# Patient Record
Sex: Female | Born: 1970 | Race: Black or African American | Hispanic: No | Marital: Single | Smoking: Current every day smoker
Health system: Southern US, Community
[De-identification: ages and names within clinical notes are randomized; demographics above are authoritative.]

## PROBLEM LIST (undated history)

## (undated) DIAGNOSIS — IMO0002 Reserved for concepts with insufficient information to code with codable children: Secondary | ICD-10-CM

## (undated) DIAGNOSIS — F1020 Alcohol dependence, uncomplicated: Secondary | ICD-10-CM

## (undated) DIAGNOSIS — I10 Essential (primary) hypertension: Secondary | ICD-10-CM

## (undated) DIAGNOSIS — M1990 Unspecified osteoarthritis, unspecified site: Secondary | ICD-10-CM

## (undated) DIAGNOSIS — M329 Systemic lupus erythematosus, unspecified: Secondary | ICD-10-CM

## (undated) HISTORY — PX: HERNIA REPAIR: SHX51

## (undated) HISTORY — PX: NO PAST SURGERIES: SHX2092

---

## 2004-04-21 ENCOUNTER — Observation Stay: Payer: Self-pay | Admitting: Obstetrics & Gynecology

## 2004-06-11 ENCOUNTER — Observation Stay: Payer: Self-pay

## 2004-06-14 ENCOUNTER — Emergency Department: Payer: Self-pay | Admitting: Emergency Medicine

## 2004-06-25 ENCOUNTER — Inpatient Hospital Stay: Payer: Self-pay

## 2007-03-30 ENCOUNTER — Emergency Department: Payer: Self-pay | Admitting: Emergency Medicine

## 2008-11-12 ENCOUNTER — Emergency Department: Payer: Self-pay | Admitting: Emergency Medicine

## 2010-04-09 ENCOUNTER — Emergency Department: Payer: Self-pay | Admitting: Emergency Medicine

## 2013-05-07 HISTORY — PX: FOREIGN BODY REMOVAL: SHX962

## 2016-04-01 ENCOUNTER — Encounter: Payer: Self-pay | Admitting: Emergency Medicine

## 2016-04-01 ENCOUNTER — Emergency Department: Payer: Medicaid Other

## 2016-04-01 ENCOUNTER — Emergency Department
Admission: EM | Admit: 2016-04-01 | Discharge: 2016-04-01 | Disposition: A | Discharge status: Home / Self Care | Payer: Medicaid Other | Attending: Emergency Medicine | Admitting: Emergency Medicine

## 2016-04-01 DIAGNOSIS — Y999 Unspecified external cause status: Secondary | ICD-10-CM | POA: Insufficient documentation

## 2016-04-01 DIAGNOSIS — Y9389 Activity, other specified: Secondary | ICD-10-CM | POA: Diagnosis not present

## 2016-04-01 DIAGNOSIS — W500XXA Accidental hit or strike by another person, initial encounter: Secondary | ICD-10-CM | POA: Diagnosis not present

## 2016-04-01 DIAGNOSIS — S60152A Contusion of left little finger with damage to nail, initial encounter: Secondary | ICD-10-CM

## 2016-04-01 DIAGNOSIS — R52 Pain, unspecified: Secondary | ICD-10-CM

## 2016-04-01 DIAGNOSIS — T1490XA Injury, unspecified, initial encounter: Secondary | ICD-10-CM

## 2016-04-01 DIAGNOSIS — S6010XA Contusion of unspecified finger with damage to nail, initial encounter: Secondary | ICD-10-CM

## 2016-04-01 DIAGNOSIS — S6992XA Unspecified injury of left wrist, hand and finger(s), initial encounter: Secondary | ICD-10-CM | POA: Diagnosis present

## 2016-04-01 DIAGNOSIS — Y929 Unspecified place or not applicable: Secondary | ICD-10-CM | POA: Diagnosis not present

## 2016-04-01 MED ORDER — HYDROCODONE-ACETAMINOPHEN 5-325 MG PO TABS: 1.0000 | ORAL_TABLET | ORAL | 0 refills | Status: DC | PRN

## 2016-04-01 MED ORDER — HYDROCODONE-ACETAMINOPHEN 5-325 MG PO TABS
1.0000 | ORAL_TABLET | Freq: Once | ORAL | Status: AC
  Administered 2016-04-01: 1 via ORAL
  Filled 2016-04-01: qty 1
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
## 2016-04-01 NOTE — ED Notes (Signed)
Patient reports being dropped off by cousin and did not drive to ER. Patient educated on not being able to drive with pain medicine and stated "I am not driving" PAtient reports she cannot find a ride. Patient smells of ETOH
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
## 2016-09-15 ENCOUNTER — Emergency Department: Payer: Medicaid Other

## 2016-09-15 ENCOUNTER — Emergency Department
Admission: EM | Admit: 2016-09-15 | Discharge: 2016-09-15 | Disposition: A | Discharge status: Home / Self Care | Payer: Medicaid Other | Attending: Emergency Medicine | Admitting: Emergency Medicine

## 2016-09-15 ENCOUNTER — Encounter: Payer: Self-pay | Admitting: *Deleted

## 2016-09-15 DIAGNOSIS — R109 Unspecified abdominal pain: Secondary | ICD-10-CM | POA: Diagnosis present

## 2016-09-15 DIAGNOSIS — J690 Pneumonitis due to inhalation of food and vomit: Secondary | ICD-10-CM

## 2016-09-15 DIAGNOSIS — F10920 Alcohol use, unspecified with intoxication, uncomplicated: Secondary | ICD-10-CM

## 2016-09-15 DIAGNOSIS — F10120 Alcohol abuse with intoxication, uncomplicated: Secondary | ICD-10-CM | POA: Diagnosis not present

## 2016-09-15 DIAGNOSIS — F17210 Nicotine dependence, cigarettes, uncomplicated: Secondary | ICD-10-CM | POA: Insufficient documentation

## 2016-09-15 DIAGNOSIS — Z79899 Other long term (current) drug therapy: Secondary | ICD-10-CM | POA: Diagnosis not present

## 2016-09-15 DIAGNOSIS — R079 Chest pain, unspecified: Secondary | ICD-10-CM

## 2016-09-15 HISTORY — DX: Alcohol dependence, uncomplicated: F10.20

## 2016-09-15 LAB — TROPONIN I

## 2016-09-15 LAB — URINE DRUG SCREEN, QUALITATIVE (ARMC ONLY)
Amphetamines, Ur Screen: NOT DETECTED
BARBITURATES, UR SCREEN: NOT DETECTED
BENZODIAZEPINE, UR SCRN: NOT DETECTED
CANNABINOID 50 NG, UR ~~LOC~~: POSITIVE — AB
Cocaine Metabolite,Ur ~~LOC~~: NOT DETECTED
MDMA (Ecstasy)Ur Screen: NOT DETECTED
Methadone Scn, Ur: NOT DETECTED
Opiate, Ur Screen: NOT DETECTED
PHENCYCLIDINE (PCP) UR S: NOT DETECTED
TRICYCLIC, UR SCREEN: POSITIVE — AB

## 2016-09-15 LAB — CBC WITH DIFFERENTIAL/PLATELET
Basophils Absolute: 0 10*3/uL (ref 0–0.1)
Basophils Relative: 0 %
EOS ABS: 0 10*3/uL (ref 0–0.7)
EOS PCT: 0 %
HCT: 43.1 % (ref 35.0–47.0)
HEMOGLOBIN: 14.9 g/dL (ref 12.0–16.0)
LYMPHS ABS: 2.2 10*3/uL (ref 1.0–3.6)
Lymphocytes Relative: 26 %
MCH: 38.4 pg — AB (ref 26.0–34.0)
MCHC: 34.5 g/dL (ref 32.0–36.0)
MCV: 111.1 fL — AB (ref 80.0–100.0)
MONO ABS: 0.2 10*3/uL (ref 0.2–0.9)
MONOS PCT: 3 %
Neutro Abs: 5.9 10*3/uL (ref 1.4–6.5)
Neutrophils Relative %: 71 %
PLATELETS: 236 10*3/uL (ref 150–440)
RBC: 3.88 MIL/uL (ref 3.80–5.20)
RDW: 13.7 % (ref 11.5–14.5)
WBC: 8.4 10*3/uL (ref 3.6–11.0)

## 2016-09-15 LAB — COMPREHENSIVE METABOLIC PANEL
ALT: 25 U/L (ref 14–54)
ANION GAP: 10 (ref 5–15)
AST: 71 U/L — ABNORMAL HIGH (ref 15–41)
Albumin: 4.4 g/dL (ref 3.5–5.0)
Alkaline Phosphatase: 52 U/L (ref 38–126)
BUN: 9 mg/dL (ref 6–20)
CALCIUM: 8.8 mg/dL — AB (ref 8.9–10.3)
CO2: 28 mmol/L (ref 22–32)
Chloride: 104 mmol/L (ref 101–111)
Creatinine, Ser: 0.63 mg/dL (ref 0.44–1.00)
Glucose, Bld: 92 mg/dL (ref 65–99)
Potassium: 3.4 mmol/L — ABNORMAL LOW (ref 3.5–5.1)
Sodium: 142 mmol/L (ref 135–145)
Total Bilirubin: 0.9 mg/dL (ref 0.3–1.2)
Total Protein: 8.4 g/dL — ABNORMAL HIGH (ref 6.5–8.1)

## 2016-09-15 LAB — ETHANOL: Alcohol, Ethyl (B): 282 mg/dL — ABNORMAL HIGH (ref <5)

## 2016-09-15 MED ORDER — IOPAMIDOL (ISOVUE-370) INJECTION 76%
75.0000 mL | Freq: Once | INTRAVENOUS | Status: AC | PRN
Start: 2016-09-15 — End: 2016-09-15
  Administered 2016-09-15: 75 mL via INTRAVENOUS

## 2016-09-15 MED ORDER — SODIUM CHLORIDE 0.9 % IV BOLUS (SEPSIS)
1000.0000 mL | Freq: Once | INTRAVENOUS | Status: AC
  Administered 2016-09-15: 1000 mL via INTRAVENOUS

## 2016-09-15 MED ORDER — AMOXICILLIN-POT CLAVULANATE 875-125 MG PO TABS: 1.0000 | ORAL_TABLET | Freq: Two times a day (BID) | ORAL | 0 refills | Status: DC

## 2016-09-15 MED ORDER — GI COCKTAIL ~~LOC~~
30.0000 mL | Freq: Once | ORAL | Status: AC
  Administered 2016-09-15: 30 mL via ORAL
  Filled 2016-09-15: qty 30

## 2016-09-15 MED ORDER — AMOXICILLIN-POT CLAVULANATE 875-125 MG PO TABS
1.0000 | ORAL_TABLET | Freq: Once | ORAL | Status: AC
  Administered 2016-09-15: 1 via ORAL
  Filled 2016-09-15: qty 1

## 2016-09-15 MED ORDER — LEVOFLOXACIN 750 MG PO TABS
750.0000 mg | ORAL_TABLET | Freq: Once | ORAL | Status: AC
  Administered 2016-09-15: 750 mg via ORAL
  Filled 2016-09-15: qty 1

## 2016-09-15 MED ORDER — LEVOFLOXACIN 750 MG PO TABS: 750.0000 mg | ORAL_TABLET | Freq: Every day | ORAL | 0 refills | Status: DC

## 2016-09-15 NOTE — ED Notes (Signed)
Pt verbalized understanding of discharge instructions. NAD at this time. 

## 2016-09-15 NOTE — ED Triage Notes (Signed)
Pt presents w/ L chest pain starting at 0330 today, woken from sleep. Pt admits to ETOH, marijuana and tobacco use last night and daily. Pt is tachycardiac and tachypneic upon arrival. Pt c/o 6 month hx of increasing dyspnea at rest.

## 2016-09-15 NOTE — ED Provider Notes (Signed)
Dg Chest 1 View  Result Date: 09/15/2016 CLINICAL DATA:  Left chest pain starting at 03:30 today.  Smoker. EXAM: CHEST 1 VIEW COMPARISON:  11/12/2008 FINDINGS: The heart size and mediastinal contours are within normal limits. Both lungs are clear. The visualized skeletal structures are unremarkable. IMPRESSION: No active disease. Electronically Signed   By: Burman NievesWilliam  Stevens M.D.   On: 09/15/2016 04:22   Ct Angio Chest Pe W Or Wo Contrast  Result Date: 09/15/2016 CLINICAL DATA:  Chest pain EXAM: CT ANGIOGRAPHY CHEST WITH CONTRAST TECHNIQUE: Multidetector CT imaging of the chest was performed using the standard protocol during bolus administration of intravenous contrast. Multiplanar CT image reconstructions and MIPs were obtained to evaluate the vascular anatomy. CONTRAST:  75 mL Isovue 370 IV COMPARISON:  Chest x-ray 09/15/2016 FINDINGS: Cardiovascular: Negative for pulmonary embolism. Normal heart size. No pericardial effusion. Normal aortic arch. Mediastinum/Nodes: Negative for mass or adenopathy Lungs/Pleura: Moderate infiltrate in the left lower lobe compatible with pneumonia. 16 x 31 mm density in the right lung base above the diaphragm most likely an area of pneumonia as well. This has ill-defined margins and appears to represent infiltrate. No pleural effusion. Upper Abdomen: Negative Musculoskeletal: Negative Review of the MIP images confirms the above findings. IMPRESSION: Left lower lobe infiltrate consistent with pneumonia. Right lower lobe density also represents probable pneumonia. Possible aspiration. No pleural effusion. Negative for pulmonary embolism. Electronically Signed   By: Marlan Palauharles  Clark M.D.   On: 09/15/2016 09:16   ----------------------------------------- 10:10 AM on 09/15/2016 -----------------------------------------  Patient presents for evaluation of chest pain, also found to be intoxicated. CT negative for pulmonary wasn't however does have notable bilateral lower lobe  infiltrates in the setting of alcohol abuse raises suspicion for possible aspiration pneumonia and/or atypical infection combination with alcohol use. She is slightly tachycardic, not febrile with no elevated white count.   ----------------------------------------- 10:18 AM on 09/15/2016 -----------------------------------------  Patient awake alert. Discussed with her that she is pneumonia and both lungs, she reports she's been having some symptoms and cough for at least a week. She has no hypoxia, and I discussed and offered admission for antibiotic treatment as pneumonias in both lungs, but patient reports that she has children that she helps care for that she feels well enough to be at home. I would agree that she does not appear toxic, she does not appear acutely dyspneic, and we'll prescribe her dual antibiotic therapy including Augmentin because of possible aspiration as well as Levaquin to cover for typical community acquired pneumonia. She denies any recent hospitalizations.  She does have a frequent dry cough and minimal rales in the left lower lung. Otherwise clear with no wheezing. Speaks in full clear sentences.  Return precautions and treatment recommendations and follow-up discussed with the patient who is agreeable with the plan.    Sharyn CreamerQuale, Mark, MD 09/15/16 1020

## 2016-09-15 NOTE — ED Provider Notes (Signed)
[facility] Emergency Department Provider Note  ____________________________________________   First MD Initiated Contact with Patient 09/15/16 0402     (approximate)  I have reviewed the triage vital signs and the nursing notes.   HISTORY  Chief Complaint Chest Pain   HPI Lindsay Price is a 46 y.o. female history of alcoholism who is presenting to the emergency department with left-sided chest pain. She says that she woke from sleep about a half an hour prior to arrival with a sharp 10 out of 10 as had a chest pain. Does not report any radiation or shortness of breath. Denies any vomiting. Says that she has had another amount of alcohol to drink today and says that she drinks multiple drinks per day and is a "alcoholic." Denies any cardiac history. Says she also smokes marijuana and last smoked tonight. However, she denies any symptoms on a normal basis with her marijuana smoking. Patient also saying this feels similar to her previous episodes of heartburn.   Past Medical History:  Diagnosis Date  . Alcoholism (HCC)     There are no active problems to display for this patient.   History reviewed. No pertinent surgical history.  Prior to Admission medications   Medication Sig Start Date End Date Taking? Authorizing Provider  HYDROcodone-acetaminophen (NORCO/VICODIN) 5-325 MG tablet Take 1 tablet by mouth every 4 (four) hours as needed for moderate pain. 04/01/16   Tommi Rumps, PA-C    Allergies Patient has no known allergies.  History reviewed. No pertinent family history.  Social History Social History  Substance Use Topics  . Smoking status: Current Every Day Smoker    Types: Cigarettes  . Smokeless tobacco: Never Used  . Alcohol use 58.8 oz/week    28 Cans of beer, 70 Shots of liquor per week    Review of Systems  Constitutional: No fever/chills Eyes: No visual changes. ENT: No sore throat. Cardiovascular:  As  above Respiratory: Denies shortness of breath. Gastrointestinal: No abdominal pain.  No nausea, no vomiting.  No diarrhea.  No constipation. Genitourinary: Negative for dysuria. Musculoskeletal: Negative for back pain. Skin: Negative for rash. Neurological: Negative for headaches, focal weakness or numbness.   ____________________________________________   PHYSICAL EXAM:  VITAL SIGNS: ED Triage Vitals  Enc Vitals Group     BP      Pulse      Resp      Temp      Temp src      SpO2      Weight      Height      Head Circumference      Peak Flow      Pain Score      Pain Loc      Pain Edu?      Excl. in GC?     Constitutional: Alert and oriented. Well appearing and in no acute distress. Eyes: Conjunctivae are normal. PERRL. EOMI. Head: Atraumatic. Nose: No congestion/rhinnorhea. Mouth/Throat: Mucous membranes are moist.  Neck: No stridor.   Cardiovascular: Normal rate, regular rhythm. Grossly normal heart sounds. Chest pain is reproducible just left of the sternum. Respiratory: Normal respiratory effort.  No retractions. Lungs CTAB. Gastrointestinal: Soft and nontender. No distention. no CVA tenderness. Musculoskeletal: No lower extremity tenderness nor edema.  No joint effusions. Neurologic:  Normal speech and language. No gross focal neurologic deficits are appreciated.  Skin:  Skin is warm, dry and intact. No rash noted. Psychiatric: Mood and affect are normal.  Speech and behavior are normal.  ____________________________________________   LABS (all labs ordered are listed, but only abnormal results are displayed)  Labs Reviewed  ETHANOL - Abnormal; Notable for the following:       Result Value   Alcohol, Ethyl (B) 282 (*)    All other components within normal limits  CBC WITH DIFFERENTIAL/PLATELET - Abnormal; Notable for the following:    MCV 111.1 (*)    MCH 38.4 (*)    All other components within normal limits  COMPREHENSIVE METABOLIC PANEL - Abnormal;  Notable for the following:    Potassium 3.4 (*)    Calcium 8.8 (*)    Total Protein 8.4 (*)    AST 71 (*)    All other components within normal limits  TROPONIN I  TROPONIN I  URINE DRUG SCREEN, QUALITATIVE ([facility] ONLY)   ____________________________________________  EKG  ED ECG REPORT I, Arelia LongestSchaevitz,  David M, the attending physician, personally viewed and interpreted this ECG.   Date: 09/15/2016  EKG Time: 0404  Rate: 107  Rhythm: sinus tachycardia  Axis: Normal  Intervals:none  ST&T Change: No ST segment elevation or depression. No abnormal T-wave inversion.  ____________________________________________  RADIOLOGY  DG Chest 1 View (Final result)  Result time 09/15/16 04:22:47  Final result by Burman NievesStevens, William, MD (09/15/16 04:22:47)           Narrative:   CLINICAL DATA: Left chest pain starting at 03:30 today. Smoker.  EXAM: CHEST 1 VIEW  COMPARISON: 11/12/2008  FINDINGS: The heart size and mediastinal contours are within normal limits. Both lungs are clear. The visualized skeletal structures are unremarkable.  IMPRESSION: No active disease.   Electronically Signed By: Burman NievesWilliam Stevens M.D. On: 09/15/2016 04:22            ____________________________________________   PROCEDURES  Procedure(s) performed:   Procedures  Critical Care performed:   ____________________________________________   INITIAL IMPRESSION / ASSESSMENT AND PLAN / ED COURSE  Pertinent labs & imaging results that were available during my care of the patient were reviewed by me and considered in my medical decision making (see chart for details).  ----------------------------------------- 7 AM on 09/15/2016 -----------------------------------------  Patient found to be intoxicated. Reevaluated at 5 AM and said the pain was completely gone after GI cocktail. However, at this time the patient is requesting pain medication from the nurse return of her chest pain  which the nurses now in her epigastrium. Patient is still tachycardic at 1 12 bpm. UDS added as well as a CT angiography for pulmonary embolus. Signed out to Dr. Fanny BienQuale.     ____________________________________________   FINAL CLINICAL IMPRESSION(S) / ED DIAGNOSES  Final diagnoses:  Chest pain  Alcohol intoxication.    NEW MEDICATIONS STARTED DURING THIS VISIT:  New Prescriptions   No medications on file     Note:  This document was prepared using Dragon voice recognition software and may include unintentional dictation errors.    Myrna BlazerSchaevitz, David Matthew, MD 09/15/16 [phone]

## 2016-09-15 NOTE — Discharge Instructions (Addendum)

## 2016-09-17 ENCOUNTER — Telehealth: Payer: Self-pay

## 2016-09-17 NOTE — Telephone Encounter (Signed)
lmov for patient to call and schedule ED fu Seen on 09/15/16 for CP °Will try again at a later time °

## 2016-09-18 NOTE — Telephone Encounter (Signed)
lmov for patient to call and schedule ED fu Seen on 09/15/16 for CP Will try again at a later time

## 2016-09-24 NOTE — Telephone Encounter (Signed)
lmov for patient to call and schedule ED fu Seen on 09/15/16 for CP °Will try again at a later time °

## 2016-09-25 NOTE — Telephone Encounter (Signed)
Unable to contact °Sent letter  °

## 2017-11-13 ENCOUNTER — Other Ambulatory Visit: Payer: Self-pay

## 2017-11-13 ENCOUNTER — Inpatient Hospital Stay
Admission: EM | Admit: 2017-11-13 | Discharge: 2017-11-14 | DRG: 917 | Disposition: A | Discharge status: Home Health | Payer: Medicaid Other | Attending: Internal Medicine | Admitting: Internal Medicine

## 2017-11-13 DIAGNOSIS — F17210 Nicotine dependence, cigarettes, uncomplicated: Secondary | ICD-10-CM | POA: Diagnosis present

## 2017-11-13 DIAGNOSIS — E8809 Other disorders of plasma-protein metabolism, not elsewhere classified: Secondary | ICD-10-CM | POA: Diagnosis present

## 2017-11-13 DIAGNOSIS — Z79899 Other long term (current) drug therapy: Secondary | ICD-10-CM

## 2017-11-13 DIAGNOSIS — G92 Toxic encephalopathy: Secondary | ICD-10-CM | POA: Diagnosis present

## 2017-11-13 DIAGNOSIS — Y906 Blood alcohol level of 120-199 mg/100 ml: Secondary | ICD-10-CM | POA: Diagnosis present

## 2017-11-13 DIAGNOSIS — F1994 Other psychoactive substance use, unspecified with psychoactive substance-induced mood disorder: Secondary | ICD-10-CM

## 2017-11-13 DIAGNOSIS — F10239 Alcohol dependence with withdrawal, unspecified: Secondary | ICD-10-CM | POA: Diagnosis present

## 2017-11-13 DIAGNOSIS — T43012A Poisoning by tricyclic antidepressants, intentional self-harm, initial encounter: Secondary | ICD-10-CM | POA: Diagnosis present

## 2017-11-13 DIAGNOSIS — T43014A Poisoning by tricyclic antidepressants, undetermined, initial encounter: Secondary | ICD-10-CM | POA: Diagnosis not present

## 2017-11-13 DIAGNOSIS — F10129 Alcohol abuse with intoxication, unspecified: Secondary | ICD-10-CM

## 2017-11-13 DIAGNOSIS — D7589 Other specified diseases of blood and blood-forming organs: Secondary | ICD-10-CM | POA: Diagnosis present

## 2017-11-13 DIAGNOSIS — E876 Hypokalemia: Secondary | ICD-10-CM | POA: Diagnosis present

## 2017-11-13 DIAGNOSIS — F1010 Alcohol abuse, uncomplicated: Secondary | ICD-10-CM

## 2017-11-13 DIAGNOSIS — T50902A Poisoning by unspecified drugs, medicaments and biological substances, intentional self-harm, initial encounter: Secondary | ICD-10-CM

## 2017-11-13 DIAGNOSIS — I10 Essential (primary) hypertension: Secondary | ICD-10-CM | POA: Diagnosis present

## 2017-11-13 DIAGNOSIS — F10229 Alcohol dependence with intoxication, unspecified: Secondary | ICD-10-CM | POA: Diagnosis present

## 2017-11-13 DIAGNOSIS — T43011A Poisoning by tricyclic antidepressants, accidental (unintentional), initial encounter: Secondary | ICD-10-CM | POA: Diagnosis not present

## 2017-11-13 LAB — COMPREHENSIVE METABOLIC PANEL
ALK PHOS: 66 U/L (ref 38–126)
ALT: 14 U/L (ref 0–44)
AST: 38 U/L (ref 15–41)
Albumin: 3.4 g/dL — ABNORMAL LOW (ref 3.5–5.0)
Anion gap: 11 (ref 5–15)
BUN: 8 mg/dL (ref 6–20)
CALCIUM: 8.7 mg/dL — AB (ref 8.9–10.3)
CHLORIDE: 109 mmol/L (ref 98–111)
CO2: 21 mmol/L — AB (ref 22–32)
CREATININE: 0.58 mg/dL (ref 0.44–1.00)
GFR calc Af Amer: >60 mL/min (ref >60)
GFR calc non Af Amer: >60 mL/min (ref >60)
Glucose, Bld: 96 mg/dL (ref 70–99)
Potassium: 4.4 mmol/L (ref 3.5–5.1)
SODIUM: 141 mmol/L (ref 135–145)
Total Bilirubin: 0.3 mg/dL (ref 0.3–1.2)
Total Protein: 7.3 g/dL (ref 6.5–8.1)

## 2017-11-13 LAB — ETHANOL: Alcohol, Ethyl (B): 169 mg/dL — ABNORMAL HIGH (ref <10)

## 2017-11-13 LAB — SALICYLATE LEVEL: Salicylate Lvl: <7 mg/dL (ref 2.8–30.0)

## 2017-11-13 LAB — URINE DRUG SCREEN, QUALITATIVE (ARMC ONLY)
AMPHETAMINES, UR SCREEN: NOT DETECTED
BENZODIAZEPINE, UR SCRN: NOT DETECTED
CANNABINOID 50 NG, UR ~~LOC~~: POSITIVE — AB
Cocaine Metabolite,Ur ~~LOC~~: NOT DETECTED
MDMA (Ecstasy)Ur Screen: NOT DETECTED
Methadone Scn, Ur: NOT DETECTED
OPIATE, UR SCREEN: NOT DETECTED
Phencyclidine (PCP) Ur S: NOT DETECTED
Tricyclic, Ur Screen: POSITIVE — AB

## 2017-11-13 LAB — BASIC METABOLIC PANEL
Anion gap: 9 (ref 5–15)
BUN: 7 mg/dL (ref 6–20)
CALCIUM: 8.2 mg/dL — AB (ref 8.9–10.3)
CO2: 22 mmol/L (ref 22–32)
Chloride: 111 mmol/L (ref 98–111)
Creatinine, Ser: 0.54 mg/dL (ref 0.44–1.00)
GFR calc Af Amer: >60 mL/min (ref >60)
Glucose, Bld: 100 mg/dL — ABNORMAL HIGH (ref 70–99)
POTASSIUM: 4.4 mmol/L (ref 3.5–5.1)
SODIUM: 142 mmol/L (ref 135–145)

## 2017-11-13 LAB — CBC
HEMATOCRIT: 36.4 % (ref 35.0–47.0)
Hemoglobin: 12.8 g/dL (ref 12.0–16.0)
MCH: 39.6 pg — AB (ref 26.0–34.0)
MCHC: 35.2 g/dL (ref 32.0–36.0)
MCV: 112.6 fL — AB (ref 80.0–100.0)
PLATELETS: 248 10*3/uL (ref 150–440)
RBC: 3.23 MIL/uL — ABNORMAL LOW (ref 3.80–5.20)
RDW: 12.6 % (ref 11.5–14.5)
WBC: 5.4 10*3/uL (ref 3.6–11.0)

## 2017-11-13 LAB — GLUCOSE, CAPILLARY
GLUCOSE-CAPILLARY: 94 mg/dL (ref 70–99)
Glucose-Capillary: 97 mg/dL (ref 70–99)
Glucose-Capillary: 92 mg/dL (ref 70–99)

## 2017-11-13 LAB — PREALBUMIN: PREALBUMIN: 25.5 mg/dL (ref 18–38)

## 2017-11-13 LAB — ACETAMINOPHEN LEVEL: Acetaminophen (Tylenol), Serum: <10 ug/mL — ABNORMAL LOW (ref 10–30)

## 2017-11-13 LAB — MRSA PCR SCREENING: MRSA by PCR: NEGATIVE

## 2017-11-13 LAB — MAGNESIUM: Magnesium: 2.1 mg/dL (ref 1.7–2.4)

## 2017-11-13 MED ORDER — ENOXAPARIN SODIUM 40 MG/0.4ML ~~LOC~~ SOLN
40.0000 mg | SUBCUTANEOUS | Status: DC
  Administered 2017-11-13: 40 mg via SUBCUTANEOUS
  Filled 2017-11-13: qty 0.4

## 2017-11-13 MED ORDER — LORAZEPAM 2 MG/ML IJ SOLN: 0.0000 mg | Freq: Two times a day (BID) | INTRAMUSCULAR | Status: DC

## 2017-11-13 MED ORDER — VITAMIN B-1 100 MG PO TABS
100.0000 mg | ORAL_TABLET | Freq: Every day | ORAL | Status: DC
  Administered 2017-11-13 – 2017-11-14 (×2): 100 mg via ORAL
  Filled 2017-11-13: qty 1

## 2017-11-13 MED ORDER — HYDRALAZINE HCL 20 MG/ML IJ SOLN
10.0000 mg | Freq: Once | INTRAMUSCULAR | Status: AC
  Administered 2017-11-13: 10 mg via INTRAVENOUS

## 2017-11-13 MED ORDER — FOLIC ACID 1 MG PO TABS
1.0000 mg | ORAL_TABLET | Freq: Every day | ORAL | Status: DC
  Administered 2017-11-13 – 2017-11-14 (×2): 1 mg via ORAL
  Filled 2017-11-13: qty 1

## 2017-11-13 MED ORDER — LORAZEPAM 1 MG PO TABS
1.0000 mg | ORAL_TABLET | Freq: Four times a day (QID) | ORAL | Status: DC | PRN
  Administered 2017-11-13: 1 mg via ORAL
  Filled 2017-11-13: qty 1

## 2017-11-13 MED ORDER — LORAZEPAM 2 MG/ML IJ SOLN
0.0000 mg | Freq: Four times a day (QID) | INTRAMUSCULAR | Status: DC
  Administered 2017-11-13: 2 mg via INTRAVENOUS
  Filled 2017-11-13: qty 1

## 2017-11-13 MED ORDER — LACTATED RINGERS IV SOLN
INTRAVENOUS | Status: DC
  Administered 2017-11-13 – 2017-11-14 (×3): via INTRAVENOUS

## 2017-11-13 MED ORDER — ADULT MULTIVITAMIN W/MINERALS CH
1.0000 | ORAL_TABLET | Freq: Every day | ORAL | Status: DC
  Administered 2017-11-13 – 2017-11-14 (×2): 1 via ORAL
  Filled 2017-11-13: qty 1

## 2017-11-13 MED ORDER — SENNOSIDES-DOCUSATE SODIUM 8.6-50 MG PO TABS: 1.0000 | ORAL_TABLET | Freq: Every evening | ORAL | Status: DC | PRN

## 2017-11-13 MED ORDER — AMLODIPINE BESYLATE 10 MG PO TABS
10.0000 mg | ORAL_TABLET | Freq: Every day | ORAL | Status: DC
  Administered 2017-11-13 – 2017-11-14 (×2): 10 mg via ORAL
  Filled 2017-11-13 (×2): qty 1

## 2017-11-13 MED ORDER — BISACODYL 5 MG PO TBEC: 5.0000 mg | DELAYED_RELEASE_TABLET | Freq: Every day | ORAL | Status: DC | PRN

## 2017-11-13 MED ORDER — THIAMINE HCL 100 MG/ML IJ SOLN: 100.0000 mg | Freq: Every day | INTRAMUSCULAR | Status: DC

## 2017-11-13 MED ORDER — SODIUM CHLORIDE 0.9 % IV BOLUS
1000.0000 mL | Freq: Once | INTRAVENOUS | Status: AC
  Administered 2017-11-13: 1000 mL via INTRAVENOUS

## 2017-11-13 MED ORDER — LORAZEPAM 2 MG/ML IJ SOLN: 1.0000 mg | Freq: Four times a day (QID) | INTRAMUSCULAR | Status: DC | PRN

## 2017-11-13 MED ORDER — POTASSIUM CHLORIDE 10 MEQ/100ML IV SOLN
10.0000 meq | INTRAVENOUS | Status: DC
  Administered 2017-11-13 (×2): 10 meq via INTRAVENOUS
  Filled 2017-11-13 (×6): qty 100

## 2017-11-13 MED ORDER — LORAZEPAM 2 MG/ML IJ SOLN: 2.0000 mg | INTRAMUSCULAR | Status: DC | PRN

## 2017-11-13 NOTE — ED Triage Notes (Signed)
Pt arrives to ED via [facility] from home with c/o reported overdose and ETOH ingestion. Per EMS, pt took an unknown amount of 50mg  Amitriptyline tablets at an unknown time. EMS arrives reporting pt responsive to painful stimuli only, but arouses to verbal stimuli by Dr Manson PasseyBrown.

## 2017-11-13 NOTE — Progress Notes (Signed)
Spoke with Counselling psychologistKristi RN at poison control. Updates given. Poison control will call back about 1100 for an update. Will Endorse.

## 2017-11-13 NOTE — Consult Note (Signed)
Name: Lindsay Price MRN: [id number] DOB: 11/02/1970    ADMISSION DATE:  11/13/2017 CONSULTATION DATE: 11/13/2017  REFERRING MD : Dr. Marjie Skiff   CHIEF COMPLAINT: Drug Overdose   BRIEF PATIENT DESCRIPTION:  47 yo female admitted with intentional overdose of amitriptyline during and ETOH ingestion   SIGNIFICANT EVENTS/STUDIES:  07/10 Pt admitted to stepdown unit with intentional overdose of amitriptyline and ETOH   HISTORY OF PRESENT ILLNESS:   This is a 47 yo female with a PMH of ETOH Abuse.  She presented to [facility] ER via EMS on 07/10 following an intentional overdose of an unknown amount of amitriptyline and ETOH ingestion.  Per ER notes the pts significant other told EMS the pt threatened to overdose on pills a week ago.  Upon EMS arrival at pts home she was responsive to painful stimulation only, however in the ER she responded to verbal stimulation.  Lab results revealed an alcohol, ethyl level of 169 and initial EKG revealed NSR with QT/Qtc of 386/496 and QRS duration 96 ms.  She received 2L NS bolus and poison control contacted by ER.  She was subsequently admitted to the stepdown unit by hospitalist team for further workup and treatment.    PAST MEDICAL HISTORY :   has a past medical history of Alcoholism (HCC).  has no past surgical history on file. Prior to Admission medications   Not on File   No Known Allergies  FAMILY HISTORY:  family history is not on file. SOCIAL HISTORY:  reports that she has been smoking cigarettes.  She has never used smokeless tobacco. She reports that she drinks about 58.8 oz of alcohol per week. She reports that she has current or past drug history. Drug: Marijuana.  REVIEW OF SYSTEMS:  Unable to assess pt obtunded   SUBJECTIVE:  Unable to assess pt obtunded, however protecting airway at this time  VITAL SIGNS: Temp:  [97.5 F (36.4 C)] 97.5 F (36.4 C) (07/10 0155) Pulse Rate:  [96-99] 99 (07/10 0330) Resp:  [15-18] 15 (07/10  0330) BP: (102-126)/(79-93) 124/91 (07/10 0330) SpO2:  [98 %-99 %] 99 % (07/10 0330) Weight:  [64.9 kg (143 lb)] 64.9 kg (143 lb) (07/10 0156)  PHYSICAL EXAMINATION: General: acutely ill appearing female, NAD  Neuro: opens eyes to painful stimulation only, PERRL  HEENT: supple, no JVD  Cardiovascular: sinus tach, no R/G Lungs: rhonchi throughout, even, non labored Abdomen: +BS x4, soft, non distended  Musculoskeletal: normal bulk and tone, no edema  Skin: intact no rashes or lesions   Recent Labs  Lab 11/13/17 0202  NA 141  K 4.4  CL 109  CO2 21*  BUN 8  CREATININE 0.58  GLUCOSE 96   Recent Labs  Lab 11/13/17 0202  HGB 12.8  HCT 36.4  WBC 5.4  PLT 248   No results found.  ASSESSMENT / PLAN: Acute encephalopathy secondary to intentional drug overdose (amitriptyline) and ETOH intoxication  Hx: ETOH Abuse P: Supplemental O2 for dyspnea and/or hypoxia  Continuous telemetry monitoring  Serial EKG's q3hrs monitor QRS duration and QTc LR @75  ml/hr  Trend BMP  Replace electrolytes as indicated  Monitor UOP VTE px: subq lovenox Trend CBC Monitor for s/sx of bleeding and transfuse for hgb <7 CIWA protocol  Seizure and Aspiration precautions Prn ativan for s/sx of seizure activity  Urine drug screen, salicylate level, and acetaminophen level pending  Psychiatry consulted appreciate input  Once mentation improves will need ETOH cessation counseling 1:1 sitter at bedside for suicide  precautions  CBG's q4hrs   Sonda Rumbleana Maebell Lyvers, AGNP  Pulmonary/Critical Care Pager [phone] (please enter 7 digits) PCCM Consult Pager [phone] (please enter 7 digits)

## 2017-11-13 NOTE — H&P (Addendum)
[facility] - [place] at [facility]   PATIENT NAME: Lindsay Price    MR#:  [id number]  DATE OF BIRTH:  June 27, 1970  DATE OF ADMISSION:  11/13/2017  PRIMARY CARE PHYSICIAN: Patient, No Pcp Per   REQUESTING/REFERRING PHYSICIAN: Darci CurrentBrown, [place] N, MD  CHIEF COMPLAINT:   Chief Complaint  Patient presents with  . Drug Overdose    HISTORY OF PRESENT ILLNESS:  Lindsay Price  is a 47 y.o. female with a known history of EtOH abuse/dependence p/w 1d Hx amitriptyline overdose. Pt obtunded/stuporous, arouses very transiently and opens eyes to external stimuli, but falls back asleep immediate; cannot provide any Hx/ROS. She is filthy, suspicious for a self care deficit, and reeks of cigarettes and alcohol. It is reported to me that pt threatened to commit suicide by overdosing on pills ~1wk ago. EMS was called overnight by pt's significant other stating she was found unconscious, having overdosed on an unknown number of amitriptyline tablets, time of ingestion also unknown. At the time of my assessment, her HR is 100, and her BP is normal at the time of my examination. Pt is protecting her airway, lungs clear, stable SpO2 on [place]. QTc 496.  PAST MEDICAL HISTORY:   Past Medical History:  Diagnosis Date  . Alcoholism (HCC)     PAST SURGICAL HISTORY:  History reviewed. No pertinent surgical history.  SOCIAL HISTORY:   Social History   Tobacco Use  . Smoking status: Current Every Day Smoker    Types: Cigarettes  . Smokeless tobacco: Never Used  Substance Use Topics  . Alcohol use: Yes    Alcohol/week: 58.8 oz    Types: 28 Cans of beer, 70 Shots of liquor per week    FAMILY HISTORY:  No family history on file.  DRUG ALLERGIES:  No Known Allergies  REVIEW OF SYSTEMS:   Review of Systems  Unable to perform ROS: Patient unresponsive  Neurological: Positive for loss of consciousness.  Psychiatric/Behavioral: Positive for substance abuse and suicidal ideas.    MEDICATIONS AT HOME:   Prior to Admission medications   Not on File      VITAL SIGNS:  Blood pressure (!) 124/91, pulse 99, temperature (!) 97.5 F (36.4 C), temperature source Oral, resp. rate 15, height 5\' 8"  (1.727 m), weight 64.9 kg (143 lb), SpO2 99 %.  PHYSICAL EXAMINATION:  Physical Exam  Constitutional: She appears well-developed and well-nourished. She is sleeping.  Non-toxic appearance. She does not have a sickly appearance. She does not appear ill. No distress. She is not intubated. Nasal cannula in place.  HENT:  Head: Atraumatic.  Mouth/Throat: Oropharynx is clear and moist. No oropharyngeal exudate.  Eyes: Conjunctivae, EOM and lids are normal. No scleral icterus.  Neck: Normal range of motion. Neck supple. No JVD present. No thyromegaly present.  Cardiovascular: Normal rate, regular rhythm, S1 normal, S2 normal and normal heart sounds.  No extrasystoles are present. Exam reveals no gallop, no S3, no S4, no distant heart sounds and no friction rub.  No murmur heard. Pulmonary/Chest: Effort normal and breath sounds normal. No accessory muscle usage or stridor. No apnea, no tachypnea and no bradypnea. She is not intubated. No respiratory distress. She has no decreased breath sounds. She has no wheezes. She has no rhonchi. She has no rales.  Abdominal: Soft. Bowel sounds are normal. She exhibits no distension. There is no tenderness. There is no rebound and no guarding.  Musculoskeletal: Normal range of motion. She exhibits no edema or tenderness.  Lymphadenopathy:    She has no cervical adenopathy.  Neurological: She is unresponsive.  Obtunded/stuporous, arouses only transiently.  Skin: Skin is warm and dry. She is not diaphoretic. No erythema.  Psychiatric: She is noncommunicative.  Obtunded/stuporous, arouses only transiently.   LABORATORY PANEL:   CBC Recent Labs  Lab 11/13/17 0202  WBC 5.4  HGB 12.8  HCT 36.4  PLT 248    ------------------------------------------------------------------------------------------------------------------  Chemistries  Recent Labs  Lab 11/13/17 0202  NA 141  K 4.4  CL 109  CO2 21*  GLUCOSE 96  BUN 8  CREATININE 0.58  CALCIUM 8.7*  AST 38  ALT 14  ALKPHOS 66  BILITOT 0.3   ------------------------------------------------------------------------------------------------------------------  Cardiac Enzymes No results for input(s): TROPONINI in the last 168 hours. ------------------------------------------------------------------------------------------------------------------  RADIOLOGY:  No results found.  IMPRESSION AND PLAN:   A/P: 61F p/w obtundation following intentional amitriptyline overdose. EtOH 169, polysubstance (amitriptyline + EtOH) ingestion. Also hypokalemia, hypocalcemia, hypoalbuminemia, macrocytosis w/o anemia, Hx EtOH abuse/dependence. -Admit to Stepdown for close monitoring -Continuous cardiac monitoring -Pulse ox -O2 -Aspiration precautions -Seizure precautions -Ativan PRN -EKG q3h, monitor QTc (most recent 496) -IVF -Psychiatry consult -1:1 sitter -Suicide precautions -Replete K+ -Mag level pending -Ionized calcium pending -Prealbumin pending -Macrocytosis likely 2/2 EtOH abuse -CIWA, Ativan, thiamine/folate/MVI -No home meds -NPO, advance diet as tolerated -Lovenox -Full code -Admission, > 2 midnights    All the records are reviewed and case discussed with ED provider. Management plans discussed with the patient, family and they are in agreement.  CODE STATUS: Full code  TOTAL TIME TAKING CARE OF THIS PATIENT: 60 minutes.    Barbaraann Rondo M.D on 11/13/2017 at 3:51 AM  Between 7am to 6pm - Pager - [phone]  After 6pm go to [email or web address] - Social research officer, government  [facility] [place] [facility]  Office  [phone]  CC: Primary care physician; Patient, No Pcp Per   Note: This dictation was  prepared with Dragon dictation along with smaller phrase technology. Any transcriptional errors that result from this process are unintentional.

## 2017-11-13 NOTE — Progress Notes (Signed)
If poison control can be reached at [phone] if there is any concerns for this patient requiring their assistance.

## 2017-11-13 NOTE — Progress Notes (Signed)
Pt admitted to room 143 from ICU. Pt is A&Ox4, however some responses are delayed. IVF infusing, telemetry applied, & room set up for seizure precautions. Pt's daughter at bedside. Pt oriented to room.   BelterraHudson, Latricia HeftKorie G

## 2017-11-13 NOTE — ED Notes (Addendum)
Per [facility](Poison [facility] recommendations: monitor pt for prolonged QRS duration, seizure activity, and urinary retention. EKGs to be performed q3hrs; sodium bicarb bolus to be given IV at 1-2 mEq/kg for QRS duration >15140ms. If pt needs to be intubated, vent rate must match pt's current respiratory rate to prevent pt from becoming acidotic d/t CO2 retention.

## 2017-11-13 NOTE — Progress Notes (Signed)
Patient ID: Alfredia Client, female   DOB: 1971/04/06, 47 y.o.   MRN: [id number]  [employer] [facility] PROGRESS NOTE  PERL FOLMAR WUJ:[id number] DOB: 1970/09/07 DOA: 11/13/2017 PCP: Patient, No Pcp Per  HPI/Subjective: Patient states that she took too many pills.  She states that she has no thoughts of hurting herself or other people.  She states she drinks 3  40's a day.  Objective: Vitals:   11/13/17 1400 11/13/17 1430  BP: 114/80 (!) 131/92  Pulse: (!) 107 (!) 103  Resp: (!) 29 (!) 25  Temp:    SpO2: 100% 100%    Filed Weights   11/13/17 0156  Weight: 64.9 kg (143 lb)    ROS: Review of Systems  Constitutional: Negative for chills and fever.  Eyes: Negative for blurred vision.  Respiratory: Negative for cough and shortness of breath.   Cardiovascular: Negative for chest pain.  Gastrointestinal: Positive for abdominal pain. Negative for constipation, diarrhea, nausea and vomiting.  Genitourinary: Negative for dysuria.  Musculoskeletal: Negative for joint pain.  Neurological: Negative for dizziness and headaches.   Exam: Physical Exam  HENT:  Nose: No mucosal edema.  Mouth/Throat: No oropharyngeal exudate or posterior oropharyngeal edema.  Eyes: Pupils are equal, round, and reactive to light. Conjunctivae, EOM and lids are normal.  Neck: No JVD present. Carotid bruit is not present. No edema present. No thyroid mass and no thyromegaly present.  Cardiovascular: S1 normal and S2 normal. Tachycardia present. Exam reveals no gallop.  No murmur heard. Pulses:      Dorsalis pedis pulses are 2+ on the right side, and 2+ on the left side.  Respiratory: No respiratory distress. She has decreased breath sounds in the right lower field and the left lower field. She has no wheezes. She has no rhonchi. She has no rales.  GI: Soft. Bowel sounds are normal. There is no tenderness.  Musculoskeletal:       Right ankle: She exhibits no swelling.       Left ankle: She exhibits no  swelling.  Lymphadenopathy:    She has no cervical adenopathy.  Neurological: She is alert. No cranial nerve deficit.  Skin: Skin is warm. No rash noted. Nails show no clubbing.  Psychiatric: She has a normal mood and affect.      Data Reviewed: Basic Metabolic Panel: Recent Labs  Lab 11/13/17 0202 11/13/17 0433  NA 141 142  K 4.4 4.4  CL 109 111  CO2 21* 22  GLUCOSE 96 100*  BUN 8 7  CREATININE 0.58 0.54  CALCIUM 8.7* 8.2*  MG  --  2.1   Liver Function Tests: Recent Labs  Lab 11/13/17 0202  AST 38  ALT 14  ALKPHOS 66  BILITOT 0.3  PROT 7.3  ALBUMIN 3.4*   CBC: Recent Labs  Lab 11/13/17 0202  WBC 5.4  HGB 12.8  HCT 36.4  MCV 112.6*  PLT 248    CBG: Recent Labs  Lab 11/13/17 0154 11/13/17 0413 11/13/17 0729  GLUCAP 94 97 92    Recent Results (from the past 240 hour(s))  MRSA PCR Screening     Status: None   Collection Time: 11/13/17  4:06 AM  Result Value Ref Range Status   MRSA by PCR NEGATIVE NEGATIVE Final    Comment:        The GeneXpert MRSA Assay (FDA approved for NASAL specimens only), is one component of a comprehensive MRSA colonization surveillance program. It is not intended to diagnose MRSA infection nor  to guide or monitor treatment for MRSA infections. Performed at [facility], [address] [place], [place] [address]       Scheduled Meds: . amLODipine  10 mg Oral Daily  . enoxaparin (LOVENOX) injection  40 mg Subcutaneous Q24H  . folic acid  1 mg Oral Daily  . LORazepam  0-4 mg Intravenous Q6H   Followed by  . [START ON 11/15/2017] LORazepam  0-4 mg Intravenous Q12H  . multivitamin with minerals  1 tablet Oral Daily  . thiamine  100 mg Oral Daily   Or  . thiamine  100 mg Intravenous Daily   Continuous Infusions: . lactated ringers 75 mL/hr at 11/13/17 1146    Assessment/Plan:  1.  Amitriptyline overdose.  Patient states that she does not want to die.  She has no thoughts of hurting herself or  other people.  Psychiatry does not believe that she needs inpatient psychiatric criteria at this point. 2.  Alcohol abuse and alcohol withdrawal.  Patient on Ciwa protocol.  Ultrasound of the liver in the morning to see if there are any signs of cirrhosis. 3.  Essential hypertension on Norvasc  Code Status:     Code Status Orders  (From admission, onward)        Start     Ordered   11/13/17 0400  Full code  Continuous     11/13/17 0359    Code Status History    This patient has a current code status but no historical code status.      Disposition Plan: To be determined  Consultants:  Critical care specialist  Psychiatrist  Time spent: 35 minutes  Mollee Neer [employer]  [employer]

## 2017-11-13 NOTE — ED Provider Notes (Signed)
[facility] Emergency Department Provider Note _____________   First MD Initiated Contact with Patient 11/13/17 0201     (approximate)  I have reviewed the triage vital signs and the nursing notes.  Level 5 caveat: History limited secondary to altered mental status HISTORY  Chief Complaint Drug Overdose    HPI Lindsay Price is a 47 y.o. female with history of alcoholism presents to the emergency department via EMS with medication overdose on present amitriptyline.  Per EMS they were notified by the patient's significant other that she took an unknown amount of amitriptyline tonight.  EMS states that the patient significant other states that she threatened a week ago to overdose on pills.   Past Medical History:  Diagnosis Date  . Alcoholism (HCC)     There are no active problems to display for this patient.   History reviewed. No pertinent surgical history.  Prior to Admission medications   Not on File    Allergies No known drug allergies No family history on file.  Social History Social History   Tobacco Use  . Smoking status: Current Every Day Smoker    Types: Cigarettes  . Smokeless tobacco: Never Used  Substance Use Topics  . Alcohol use: Yes    Alcohol/week: 58.8 oz    Types: 28 Cans of beer, 70 Shots of liquor per week  . Drug use: Yes    Types: Marijuana    Review of Systems Constitutional: No fever/chills Eyes: No visual changes. ENT: No sore throat. Cardiovascular: Denies chest pain. Respiratory: Denies shortness of breath. Gastrointestinal: No abdominal pain.  No nausea, no vomiting.  No diarrhea.  No constipation. Genitourinary: Negative for dysuria. Musculoskeletal: Negative for neck pain.  Negative for back pain. Integumentary: Negative for rash. Neurological: Negative for headaches, focal weakness or numbness. Psychiatric:Reported medication overdose  ____________________________________________   PHYSICAL  EXAM:  VITAL SIGNS: ED Triage Vitals  Enc Vitals Group     BP 11/13/17 0155 102/79     Pulse Rate 11/13/17 0155 98     Resp 11/13/17 0155 18     Temp 11/13/17 0155 (!) 97.5 F (36.4 C)     Temp Source 11/13/17 0155 Oral     SpO2 11/13/17 0155 98 %     Weight 11/13/17 0156 64.9 kg (143 lb)     Height 11/13/17 0156 1.727 m (5\' 8" )     Head Circumference --      Peak Flow --      Pain Score 11/13/17 0156 0     Pain Loc --      Pain Edu? --      Excl. in GC? --     Constitutional: Asleep, responds to noxious stimuli. Eyes: Conjunctivae are normal. PERRL. EOMI. Head: Atraumatic. Mouth/Throat: Mucous membranes are moist. Oropharynx non-erythematous. Neck: No stridor.   Cardiovascular: Normal rate, regular rhythm. Good peripheral circulation. Grossly normal heart sounds. Respiratory: Normal respiratory effort.  No retractions. Lungs CTAB. Gastrointestinal: Soft and nontender. No distention.  Musculoskeletal: No lower extremity tenderness nor edema. No gross deformities of extremities. Neurologic: Positive gag reflex. No gross focal neurologic deficits are appreciated.  Skin:  Skin is warm, dry and intact. No rash noted.   ____________________________________________   LABS (all labs ordered are listed, but only abnormal results are displayed)  Labs Reviewed  GLUCOSE, CAPILLARY  COMPREHENSIVE METABOLIC PANEL  ETHANOL  CBC  URINE DRUG SCREEN, QUALITATIVE ([facility] ONLY)  ACETAMINOPHEN LEVEL  SALICYLATE LEVEL   ____________________________________________  EKG  ED ECG REPORT I, [place] N Lashundra Shiveley, the attending physician, personally viewed and interpreted this ECG.   Date: 11/13/2017  EKG Time: 1:54 AM  Rate: 99  Rhythm: {Normal sinus rhythm  Axis: Normal  Intervals: QTC 496  ST&T Change: None     Procedures   ____________________________________________   INITIAL IMPRESSION / ASSESSMENT AND PLAN / ED COURSE  As part of my medical decision making, I reviewed  the following data within the electronic MEDICAL RECORD NUMBER   47 year old female presenting with above-stated history and physical exam secondary to reported medication overdose.  Patient protecting airway with oxygen saturation of 98% at this time and as such did not require endotracheal intubation for airway protection.  Given unknown time of ingestion and patient decreased mental status activated charcoal was not administered.  Patient involuntarily committed.  Laboratory data notable thus far for elevated alcohol level 169.  Patient discussed with Dr. Bosie Helper for hospital admission further evaluation and management.  ___________________________________  FINAL CLINICAL IMPRESSION(S) / ED DIAGNOSES  Final diagnoses:  Medication overdose, intentional self-harm, initial encounter (HCC)  Alcohol abuse with intoxication (HCC)     MEDICATIONS GIVEN DURING THIS VISIT:  Medications  sodium chloride 0.9 % bolus 1,000 mL (1,000 mLs Intravenous New Bag/Given 11/13/17 0201)  sodium chloride 0.9 % bolus 1,000 mL (1,000 mLs Intravenous New Bag/Given 11/13/17 0200)     ED Discharge Orders    None       Note:  This document was prepared using Dragon voice recognition software and may include unintentional dictation errors.    Darci Current, MD 11/13/17 [phone]

## 2017-11-13 NOTE — Progress Notes (Signed)
Unable to complete admission assessment due to patient's mental status.

## 2017-11-13 NOTE — Consult Note (Signed)
^^^For Educational Purposes only^^^  Name: Lindsay Price MRN: [id number] DOB: 08/28/1970    ADMISSION DATE:  11/13/2017 CONSULTATION DATE:  11/13/2017  REFERRING MD :  Marjie SkiffSridharan, MD   CHIEF COMPLAINT: Intentional Drug Overdose  BRIEF PATIENT DESCRIPTION:  47 year old, African-American female presents with intentional overdose of Amitriptyline tablets with ETOH on board.   SIGNIFICANT EVENTS/STUDIES:  7/10 admitted to the step down unit for intentional overdose with Amitriptyline tablets with ETOH on board.    HISTORY OF PRESENT ILLNESS:  Lindsay Price is a 47 y/o, African-American female with PMH significant for ETOH abuse, that presented to the ED (on 7/10) via [facility] due to intentional drug overdose with ETOH on board. Per EMS report, the pt's significant other stated that the pt took an unknown amount of Amitripytline 50mg  tablets at an unknown time and was found unconcious at home but had threaten suicide via overdose a week ago. The pt was only responsive to painful stimuli for EMS, however, once in the ED she was responsive to verbal stimuli for the ER Physician. While in the ED the pt was found to have an ETOH level of 169, EKG revealed NSR with QRS of 96 and her urine showed positive for Cannabinoid and Tricyclic. Pt was given a 2L NS bolus, poison control was contacted and she was admitted to the step-down unit for further evaluation by the hospitalist team. Pt is now only responsive to painful stimuli and ROS was unable to be performed due to stupor; however, pt is protecting her airway at this time and does not show any signs of respiratory distress.     PAST MEDICAL HISTORY :   has a past medical history of Alcoholism (HCC).  has no past surgical history on file. Prior to Admission medications   Not on File   No Known Allergies  FAMILY HISTORY:  family history is not on file. SOCIAL HISTORY:  reports that she has been smoking cigarettes.  She has never used smokeless tobacco.  She reports that she drinks about 58.8 oz of alcohol per week. She reports that she has current or past drug history. Drug: Marijuana.  REVIEW OF SYSTEMS:   Unable to assess due to stupor   SUBJECTIVE: unable to perform due to stupor    VITAL SIGNS: Temp:  [97.5 F (36.4 C)] 97.5 F (36.4 C) (07/10 0155) Pulse Rate:  [96-99] 99 (07/10 0330) Resp:  [15-18] 15 (07/10 0330) BP: (102-126)/(79-93) 124/91 (07/10 0330) SpO2:  [98 %-99 %] 99 % (07/10 0330) Weight:  [64.9 kg (143 lb)] 64.9 kg (143 lb) (07/10 0156)  PHYSICAL EXAMINATION: General:  Pt appears disheveled, appears undernourished, does not show any signs of respiratory distress   Neuro: pupils are equal, round and reactive to light, GCS of 7, decreased sensation in all extremities HEENT: Head: normocephalic, intact, atraumatic; Eyes: symmetrical, sclera red, conjunctiva pink, negative for drainage; Ears: intact, symmetrical, negative for exudates; Nose: intact, atraumatic, negative for hemorrhaging or nasal drainage; Throat: intact, trachea midline, negative for nodules, atraumatic Cardiovascular:  S1, s2 noted, sinus tachycardia, +3 peripheral pulses, +3 pedal pulses, negative for JVD, capillary refill is <3 seconds in all extremities. Lungs: bilateral lung sounds clear upon auscultation, chest expansion symmetrical, negative for crepitus  Abdomen: round, non-distended, non-tender, bowel sounds active x4 quadrants,  Musculoskeletal:  Decreased strength, passive ROM in all extremities Skin: appropriate for ethnicity, small laceration to left knee, multiple scarring on extremities, negative for bruising or rash     Recent  Labs  Lab 11/13/17 0202  NA 141  K 4.4  CL 109  CO2 21*  BUN 8  CREATININE 0.58  GLUCOSE 96   Recent Labs  Lab 11/13/17 0202  HGB 12.8  HCT 36.4  WBC 5.4  PLT 248   No results found.  ASSESSMENT / PLAN: Acute encephalopathy likely secondary to drug overdose and ETOH intoxication  Plan:    Supplemental O2 to maintain O2 sats >93% Continuous Telemetry monitoring Suicide Precautions with 1:1 sitter at bedside EKG q3h to monitor QRS and QTc Seizure Precautions CIWA Protocol with prn Ativan for symptoms of ETOH withdrawal  LR IV fluid at 75cc/hr Monitor Urine Output  Trend CBC Trend BMP Psychiatry consult  NPO, advance diet as pt becomes more Alert CBG's q4hrs   Pulmonary and Critical Care Medicine [facility] Pager: [phone]  11/13/2017, 4:28 AM

## 2017-11-13 NOTE — Progress Notes (Signed)
[facility] Physician-Brief Progress Note Patient Name: Lindsay Price ClientKim E Kovacich DOB: 1970-05-14 MRN: [id number]   Date of Service  11/13/2017  HPI/Events of Note  Pt with amitryptilline and ETOH OD.  Stable on camera check . No report provided.   eICU Interventions  No current needs, ICU RN to eval.         Shane CrutchPradeep Mohamud Mrozek 11/13/2017, 4:07 AM

## 2017-11-13 NOTE — Consult Note (Signed)
[facility] Consult   Reason for Consult: Consult for 47 year old woman who came to the hospital yesterday after taking what appears to be an overdose of amitriptyline Referring Physician: Bobbye Charleston Patient Identification: JAKHIA BUXTON MRN:  [id number] Principal Diagnosis: Amitriptyline overdose of undetermined intent Diagnosis:   Patient Active Problem List   Diagnosis Date Noted  . Amitriptyline overdose of undetermined intent [T43.014A] 11/13/2017  . Alcohol abuse [F10.10] 11/13/2017  . Substance induced mood disorder [facility]) [F19.94] 11/13/2017    Total Time spent with patient: 1 hour  Subjective:   MYLINDA BROOK is a 47 y.o. female patient admitted with "I do not know".  HPI: Patient seen chart reviewed.  This 47 year old woman was brought to the emergency room yesterday after being found apparently by family passed out at home with evidence of an overdose of medication most likely amitriptyline.  The specific circumstances are still a little vague.  On interview today the patient knows that she is in the hospital but does not remember coming here.  She does not remember taking an overdose of any pills.  She admits that she was drinking yesterday and says that she usually drinks about 440 ounces of beer a day.  Uses marijuana as well.  Denies other drug use.  Patient says her mood has been a little bad recently.  Gets moody and irritable.  Cannot really put her finger on what it is that gets on her nerves.  She denies though having been aware of any suicidal or homicidal thoughts.  Denies any hallucinations.  Not currently receiving any psychiatric treatment.  Social history: Patient indicates that she lives with family.  Again, details are a little vague.  Reports are that she was very filthy when she came to the ER yesterday.  It is not clear exactly what situation she does have.  Medical history: No known medical problems identified she does not know of any medical  issues  Substance abuse history: Patient admits to chronic alcohol abuse.  She does not know of any history of seizures or DTs although she seems vaguely to think she might of had a seizure sometime in the past.  Does not appear to have much familiarity with substance abuse treatment.  Past Psychiatric History: Denies any past known psychiatric history.  Denies any history of suicide attempts or violence.  There are no old psychiatric notes anywhere in her chart.  Denies any history of psychiatric medicine.  Risk to Self: Is patient at risk for suicide?: Yes Risk to Others:   Prior Inpatient Therapy:   Prior Outpatient Therapy:    Past Medical History:  Past Medical History:  Diagnosis Date  . Alcoholism ([place])    History reviewed. No pertinent surgical history. Family History: No family history on file. Family Psychiatric  History: Denies knowing of any family history Social History:  Social History   Substance and Sexual Activity  Alcohol Use Yes  . Alcohol/week: 58.8 oz  . Types: 28 Cans of beer, 70 Shots of liquor per week     Social History   Substance and Sexual Activity  Drug Use Yes  . Types: Marijuana    Social History   Socioeconomic History  . Marital status: Single    Spouse name: Not on file  . Number of children: Not on file  . Years of education: Not on file  . Highest education level: Not on file  Occupational History  . Not on file  Social Needs  .  Financial resource strain: Not on file  . Food insecurity:    Worry: Not on file    Inability: Not on file  . Transportation needs:    Medical: Not on file    Non-medical: Not on file  Tobacco Use  . Smoking status: Current Every Day Smoker    Types: Cigarettes  . Smokeless tobacco: Never Used  Substance and Sexual Activity  . Alcohol use: Yes    Alcohol/week: 58.8 oz    Types: 28 Cans of beer, 70 Shots of liquor per week  . Drug use: Yes    Types: Marijuana  . Sexual activity: Yes    Birth  control/protection: None  Lifestyle  . Physical activity:    Days per week: Not on file    Minutes per session: Not on file  . Stress: Not on file  Relationships  . Social connections:    Talks on phone: Not on file    Gets together: Not on file    Attends religious service: Not on file    Active member of club or organization: Not on file    Attends meetings of clubs or organizations: Not on file    Relationship status: Not on file  Other Topics Concern  . Not on file  Social History Narrative  . Not on file   Additional Social History:    Allergies:  No Known Allergies  Labs:  Results for orders placed or performed during the hospital encounter of 11/13/17 (from the past 48 hour(s))  Glucose, capillary     Status: None   Collection Time: 11/13/17  1:54 AM  Result Value Ref Range   Glucose-Capillary 94 70 - 99 mg/dL  Comprehensive metabolic panel     Status: Abnormal   Collection Time: 11/13/17  2:02 AM  Result Value Ref Range   Sodium 141 135 - 145 mmol/L   Potassium 4.4 3.5 - 5.1 mmol/L   Chloride 109 98 - 111 mmol/L    Comment: Please note change in reference range.   CO2 21 (L) 22 - 32 mmol/L   Glucose, Bld 96 70 - 99 mg/dL    Comment: Please note change in reference range.   BUN 8 6 - 20 mg/dL    Comment: Please note change in reference range.   Creatinine, Ser 0.58 0.44 - 1.00 mg/dL   Calcium 8.7 (L) 8.9 - 10.3 mg/dL   Total Protein 7.3 6.5 - 8.1 g/dL   Albumin 3.4 (L) 3.5 - 5.0 g/dL   AST 38 15 - 41 U/L   ALT 14 0 - 44 U/L    Comment: Please note change in reference range.   Alkaline Phosphatase 66 38 - 126 U/L   Total Bilirubin 0.3 0.3 - 1.2 mg/dL   GFR calc non Af Amer >60 >60 mL/min   GFR calc Af Amer >60 >60 mL/min    Comment: (NOTE) The eGFR has been calculated using the CKD EPI equation. This calculation has not been validated in all clinical situations. eGFR's persistently <60 mL/min signify possible Chronic Kidney Disease.    Anion gap 11 5 -  15    Comment: Performed at [facility], [address] [place], [place] [address]  Ethanol     Status: Abnormal   Collection Time: 11/13/17  2:02 AM  Result Value Ref Range   Alcohol, Ethyl (B) 169 (H) <10 mg/dL    Comment: (NOTE) Lowest detectable limit for serum alcohol is 10 mg/dL. For medical purposes  only. Performed at [facility], [address] [place], [place] [address]   cbc     Status: Abnormal   Collection Time: 11/13/17  2:02 AM  Result Value Ref Range   WBC 5.4 3.6 - 11.0 K/uL   RBC 3.23 (L) 3.80 - 5.20 MIL/uL   Hemoglobin 12.8 12.0 - 16.0 g/dL   HCT 36.4 35.0 - 47.0 %   MCV 112.6 (H) 80.0 - 100.0 fL   MCH 39.6 (H) 26.0 - 34.0 pg   MCHC 35.2 32.0 - 36.0 g/dL   RDW 12.6 11.5 - 14.5 %   Platelets 248 150 - 440 K/uL    Comment: Performed at [facility], [address] [place], [place] [address]  MRSA PCR Screening     Status: None   Collection Time: 11/13/17  4:06 AM  Result Value Ref Range   MRSA by PCR NEGATIVE NEGATIVE    Comment:        The GeneXpert MRSA Assay (FDA approved for NASAL specimens only), is one component of a comprehensive MRSA colonization surveillance program. It is not intended to diagnose MRSA infection nor to guide or monitor treatment for MRSA infections. Performed at [facility], [address] [place], [place] [address]   Glucose, capillary     Status: None   Collection Time: 11/13/17  4:13 AM  Result Value Ref Range   Glucose-Capillary 97 70 - 99 mg/dL   Comment 1 Notify RN   Acetaminophen level     Status: Abnormal   Collection Time: 11/13/17  4:33 AM  Result Value Ref Range   Acetaminophen (Tylenol), Serum <10 (L) 10 - 30 ug/mL    Comment: (NOTE) Therapeutic concentrations vary significantly. A range of 10-30 ug/mL  may be an effective concentration for many patients. However, some  are best treated at concentrations outside of this range. Acetaminophen concentrations >150  ug/mL at 4 hours after ingestion  and >50 ug/mL at 12 hours after ingestion are often associated with  toxic reactions. Performed at [facility], [address] [place], [place] [address]   Salicylate level     Status: None   Collection Time: 11/13/17  4:33 AM  Result Value Ref Range   Salicylate Lvl <1.6 2.8 - 30.0 mg/dL    Comment: Performed at [facility], [address] [place], [place] [address]  Basic metabolic panel     Status: Abnormal   Collection Time: 11/13/17  4:33 AM  Result Value Ref Range   Sodium 142 135 - 145 mmol/L   Potassium 4.4 3.5 - 5.1 mmol/L   Chloride 111 98 - 111 mmol/L    Comment: Please note change in reference range.   CO2 22 22 - 32 mmol/L   Glucose, Bld 100 (H) 70 - 99 mg/dL    Comment: Please note change in reference range.   BUN 7 6 - 20 mg/dL    Comment: Please note change in reference range.   Creatinine, Ser 0.54 0.44 - 1.00 mg/dL   Calcium 8.2 (L) 8.9 - 10.3 mg/dL   GFR calc non Af Amer >60 >60 mL/min   GFR calc Af Amer >60 >60 mL/min    Comment: (NOTE) The eGFR has been calculated using the CKD EPI equation. This calculation has not been validated in all clinical situations. eGFR's persistently <60 mL/min signify possible Chronic Kidney Disease.    Anion gap 9 5 - 15    Comment: Performed at [facility],  [place], [place] [address]  Prealbumin     Status: None   Collection Time: 11/13/17  4:33 AM  Result Value Ref Range   Prealbumin 25.5 18 - 38 mg/dL    Comment: Performed at [facility] [address], [place], [place] [address]  Magnesium     Status: None   Collection Time: 11/13/17  4:33 AM  Result Value Ref Range   Magnesium 2.1 1.7 - 2.4 mg/dL    Comment: Performed at [facility], [address] [place], [place] [address]  Glucose, capillary     Status: None   Collection Time: 11/13/17  7:29 AM  Result Value Ref Range   Glucose-Capillary 92 70 - 99 mg/dL   Urine Drug Screen, Qualitative     Status: Abnormal   Collection Time: 11/13/17  8:44 AM  Result Value Ref Range   Tricyclic, Ur Screen POSITIVE (A) NONE DETECTED   Amphetamines, Ur Screen NONE DETECTED NONE DETECTED   MDMA (Ecstasy)Ur Screen NONE DETECTED NONE DETECTED   Cocaine Metabolite,Ur [place] NONE DETECTED NONE DETECTED   Opiate, Ur Screen NONE DETECTED NONE DETECTED   Phencyclidine (PCP) Ur S NONE DETECTED NONE DETECTED   Cannabinoid 50 Ng, Ur [place] POSITIVE (A) NONE DETECTED   Barbiturates, Ur Screen (A) NONE DETECTED    Result not available. Reagent lot number recalled by manufacturer.   Benzodiazepine, Ur Scrn NONE DETECTED NONE DETECTED   Methadone Scn, Ur NONE DETECTED NONE DETECTED    Comment: (NOTE) Tricyclics + metabolites, urine    Cutoff 1000 ng/mL Amphetamines + metabolites, urine  Cutoff 1000 ng/mL MDMA (Ecstasy), urine              Cutoff 500 ng/mL Cocaine Metabolite, urine          Cutoff 300 ng/mL Opiate + metabolites, urine        Cutoff 300 ng/mL Phencyclidine (PCP), urine         Cutoff 25 ng/mL Cannabinoid, urine                 Cutoff 50 ng/mL Barbiturates + metabolites, urine  Cutoff 200 ng/mL Benzodiazepine, urine              Cutoff 200 ng/mL Methadone, urine                   Cutoff 300 ng/mL The urine drug screen provides only a preliminary, unconfirmed analytical test result and should not be used for non-medical purposes. Clinical consideration and professional judgment should be applied to any positive drug screen result due to possible interfering substances. A more specific alternate chemical method must be used in order to obtain a confirmed analytical result. Gas chromatography / mass spectrometry (GC/MS) is the preferred confirmat ory method. Performed at [facility], [address] [place], [place] [address]     Current Facility-Administered Medications  Medication Dose Route Frequency Provider Last Rate Last Dose  .  amLODipine (NORVASC) tablet 10 mg  10 mg Oral Daily Flora Lipps, MD   10 mg at 11/13/17 1043  . bisacodyl (DULCOLAX) EC tablet 5 mg  5 mg Oral Daily PRN Arta Silence, MD      . enoxaparin (LOVENOX) injection 40 mg  40 mg Subcutaneous Q24H Sridharan, Prasanna, MD      . folic acid (FOLVITE) tablet 1 mg  1 mg Oral Daily Arta Silence, MD   1 mg at 11/13/17 0907  . lactated ringers infusion  Intravenous Continuous Arta Silence, MD 75 mL/hr at 11/13/17 1146    . LORazepam (ATIVAN) injection 0-4 mg  0-4 mg Intravenous Q6H Arta Silence, MD       Followed by  . [START ON 11/15/2017] LORazepam (ATIVAN) injection 0-4 mg  0-4 mg Intravenous Q12H Arta Silence, MD      . LORazepam (ATIVAN) tablet 1 mg  1 mg Oral Q6H PRN Arta Silence, MD   1 mg at 11/13/17 1042   Or  . LORazepam (ATIVAN) injection 1 mg  1 mg Intravenous Q6H PRN Arta Silence, MD      . LORazepam (ATIVAN) injection 2 mg  2 mg Intravenous Q15 min PRN Arta Silence, MD      . multivitamin with minerals tablet 1 tablet  1 tablet Oral Daily Arta Silence, MD   1 tablet at 11/13/17 0908  . senna-docusate (Senokot-S) tablet 1 tablet  1 tablet Oral QHS PRN Arta Silence, MD      . thiamine (VITAMIN B-1) tablet 100 mg  100 mg Oral Daily Arta Silence, MD   100 mg at 11/13/17 0908   Or  . thiamine (B-1) injection 100 mg  100 mg Intravenous Daily Arta Silence, MD        Musculoskeletal: Strength & Muscle Tone: within normal limits Gait & Station: normal Patient leans: N/A  Psychiatric Specialty Exam: Physical Exam  Nursing note and vitals reviewed. Constitutional: She appears well-developed and well-nourished.  HENT:  Head: Normocephalic and atraumatic.  Eyes: Pupils are equal, round, and reactive to light. Conjunctivae are normal.  Neck: Normal range of motion.  Cardiovascular: Regular rhythm and normal heart sounds.  Respiratory: Effort normal. No respiratory  distress.  GI: Soft.  Musculoskeletal: Normal range of motion.  Neurological: She is alert.  Skin: Skin is warm and dry.  Psychiatric: Her affect is blunt. Her speech is delayed. She is slowed. Cognition and memory are impaired. She expresses impulsivity and inappropriate judgment. She expresses no homicidal and no suicidal ideation. She exhibits abnormal recent memory.    Review of Systems  Constitutional: Negative.   HENT: Negative.   Eyes: Negative.   Respiratory: Negative.   Cardiovascular: Negative.   Gastrointestinal: Negative.   Musculoskeletal: Negative.   Skin: Negative.   Neurological: Negative.   Psychiatric/Behavioral: Positive for substance abuse. Negative for depression, hallucinations, memory loss and suicidal ideas. The patient is not nervous/anxious and does not have insomnia.     Blood pressure 127/85, pulse (!) 118, temperature 98.5 F (36.9 C), temperature source Oral, resp. rate (!) 27, height _0  (1.727 m), weight 64.9 kg (143 lb), SpO2 99 %.Body mass index is 21.74 kg/m.  General Appearance: Casual and Disheveled  Eye Contact:  Fair  Speech:  Garbled and Slow  Volume:  Decreased  Mood:  Dysphoric  Affect:  Congruent  Thought Process:  Goal Directed  Orientation:  Full (Time, Place, and Person)  Thought Content:  Logical  Suicidal Thoughts:  No  Homicidal Thoughts:  No  Memory:  Immediate;   Fair Recent;   Poor Remote;   Poor  Judgement:  Impaired  Insight:  Shallow  Psychomotor Activity:  Decreased  Concentration:  Concentration: Poor  Recall:  Poor  Fund of Knowledge:  Fair  Language:  Fair  Akathisia:  No  Handed:  Right  AIMS (if indicated):     Assets:  Resilience  ADL's:  Impaired  Cognition:  Impaired,  Mild  Sleep:        Treatment Plan  Summary: Plan 47 year old woman presented to the hospital with what appears to have been an overdose on amitriptyline.  Reports in the chart indicate that this probably was not even her medicine but  belongs to someone else.  Patient was able to wake up and have a coherent enough conversation today to tell me that she had absolutely no wish to die.  She seemed to be very sincere about that.  No thoughts of hurting anyone else.  Sounds like she had a blackout during which she may have impulsively overdosed but does not even remember the details of the situation.  I do not see a phone number for anyone I can readily get in touch with to get any other collateral history right now.  I think based on the interview I had with her however I am comfortable with saying she does not need a suicide sitter.  Patient should probably be continued on the CIWA protocol for now.  She should wake up within the next day or so and may be will get a little clearer picture of what is going on with her.  No specific indication for any psychiatric medicine other than detox.  Disposition: No evidence of imminent risk to self or others at present.   Patient does not meet criteria for psychiatric inpatient admission. Supportive therapy provided about ongoing stressors.  Alethia Berthold, MD 11/13/2017 2:43 PM

## 2017-11-13 NOTE — Progress Notes (Signed)
Pt currently alert, oriented to self, place, time, does not remember what brought her to the hospital. Pt calm and cooperative. Bladder scan showed 650ml, when asked if pt needed to void, pt stated, "I may be able to." Pt asked to try and void, was able to void 250ml. Will try again to get her to void more. Pt asking for water, provided with ice chips as order was NPO except sips with meds and ice chips. Spoke with Dr. Belia HemanKasa regarding pt's elevated BP, verbal order given for 10mg  hydralazine IVP. Also received verbal order to liberate pt's diet.

## 2017-11-14 ENCOUNTER — Inpatient Hospital Stay: Payer: Medicaid Other

## 2017-11-14 DIAGNOSIS — T43011A Poisoning by tricyclic antidepressants, accidental (unintentional), initial encounter: Secondary | ICD-10-CM

## 2017-11-14 LAB — BASIC METABOLIC PANEL
ANION GAP: 5 (ref 5–15)
BUN: 6 mg/dL (ref 6–20)
CO2: 27 mmol/L (ref 22–32)
Calcium: 8.4 mg/dL — ABNORMAL LOW (ref 8.9–10.3)
Chloride: 104 mmol/L (ref 98–111)
Creatinine, Ser: 0.6 mg/dL (ref 0.44–1.00)
GFR calc non Af Amer: >60 mL/min (ref >60)
Glucose, Bld: 91 mg/dL (ref 70–99)
POTASSIUM: 3.9 mmol/L (ref 3.5–5.1)
Sodium: 136 mmol/L (ref 135–145)

## 2017-11-14 LAB — CALCIUM, IONIZED: CALCIUM, IONIZED, SERUM: 4.5 mg/dL (ref 4.5–5.6)

## 2017-11-14 LAB — HIV ANTIBODY (ROUTINE TESTING W REFLEX): HIV Screen 4th Generation wRfx: NONREACTIVE

## 2017-11-14 MED ORDER — AMLODIPINE BESYLATE 10 MG PO TABS: 10.0000 mg | ORAL_TABLET | Freq: Every day | ORAL | 0 refills | Status: DC

## 2017-11-14 MED ORDER — ADULT MULTIVITAMIN W/MINERALS CH: 1.0000 | ORAL_TABLET | Freq: Every day | ORAL | 0 refills | Status: DC

## 2017-11-14 MED ORDER — CHLORDIAZEPOXIDE HCL 25 MG PO CAPS: 25.0000 mg | ORAL_CAPSULE | Freq: Every morning | ORAL | Status: DC

## 2017-11-14 MED ORDER — CHLORDIAZEPOXIDE HCL 25 MG PO CAPS: 25.0000 mg | ORAL_CAPSULE | Freq: Every morning | ORAL | 0 refills | Status: DC

## 2017-11-14 MED ORDER — FOLIC ACID 1 MG PO TABS: 1.0000 mg | ORAL_TABLET | Freq: Every day | ORAL | 0 refills | Status: DC

## 2017-11-14 MED ORDER — THIAMINE HCL 100 MG PO TABS: 100.0000 mg | ORAL_TABLET | Freq: Every day | ORAL | 0 refills | Status: DC

## 2017-11-14 NOTE — Discharge Summary (Signed)
[facility]   [name] NAME: Lindsay [name]    MR#:  [id number]  DATE OF BIRTH:  08-Jun-1970  DATE OF ADMISSION:  11/13/2017 ADMITTING PHYSICIAN: Barbaraann RondoPrasanna Sridharan, MD  DATE OF DISCHARGE: 11/14/2017  PRIMARY CARE PHYSICIAN: [facility]   ADMISSION DIAGNOSIS:  [facility] - Unresponsive  DISCHARGE DIAGNOSIS:  Principal Problem:   Amitriptyline overdose of undetermined intent Active Problems:   Alcohol abuse   Substance induced mood disorder (HCC)   SECONDARY DIAGNOSIS:   Past Medical History:  Diagnosis Date  . Alcoholism (HCC)     HOSPITAL COURSE:   1.  Acute encephalopathy and amitriptyline overdose.  [name] was seen in consultation by psychiatry Dr. Toni Amendlapacs and he did not think any further psychiatric monitoring was needed.  She did not meet inpatient psychiatric criteria at this time. 2.  Alcohol abuse.  [name] advised to stop drinking alcohol.  She has not received any Ativan since yesterday.  Ultrasound of Lindsay liver did not show any signs of cirrhosis yet.  [name] given a few doses of Librium to go home with.  Prescribed thiamine multivitamin and folic acid. 3.  Essential hypertension on Norvasc.  I stopped IV fluids blood pressure should come down after stopping IV fluids. 4.  Unsteady gait.  Physical therapy recommended home with home health. 5.  Cannabis seen on urine toxicology also.  DISCHARGE CONDITIONS:   Fair secondary to insight being poor  CONSULTS OBTAINED:  Treatment Team:  Barbaraann RondoSridharan, Prasanna, MD Clapacs, Jackquline DenmarkJohn T, MD  DRUG ALLERGIES:  No Known Allergies  DISCHARGE MEDICATIONS:   Allergies as of 11/14/2017   No Known Allergies     Medication List    TAKE these medications   amLODipine 10 MG tablet Commonly known as:  NORVASC Take 1 tablet (10 mg total) by mouth daily. Start taking on:  11/15/2017   chlordiazePOXIDE 25 MG capsule Commonly known as:  LIBRIUM Take 1 capsule (25 mg total) by mouth  every morning. Start taking on:  11/15/2017   folic acid 1 MG tablet Commonly known as:  FOLVITE Take 1 tablet (1 mg total) by mouth daily. Start taking on:  11/15/2017   multivitamin with minerals Tabs tablet Take 1 tablet by mouth daily. Start taking on:  11/15/2017   thiamine 100 MG tablet Take 1 tablet (100 mg total) by mouth daily. Start taking on:  11/15/2017            Durable Medical Equipment  (From admission, onward)        Start     Ordered   11/14/17 1306  For home use only DME Walker rolling  Once    Question:  [name] needs a walker to treat with Lindsay following condition  Answer:  Unsteady gait   11/14/17 1305       DISCHARGE INSTRUCTIONS:    Follow-up PMD 6 days  If you experience worsening of your admission symptoms, develop shortness of breath, life threatening emergency, suicidal or homicidal thoughts you must seek medical attention immediately by calling 911 or calling your MD immediately  if symptoms less severe.  You Must read complete instructions/literature along with all Lindsay possible adverse reactions/side effects for all Lindsay Medicines you take and that have been prescribed to you. Take any new Medicines after you have completely understood and accept all Lindsay possible adverse reactions/side effects.   Please note  You were cared for by a hospitalist during your hospital stay. If you have any  questions about your discharge medications or Lindsay care you received while you were in Lindsay hospital after you are discharged, you can call Lindsay unit and asked to speak with Lindsay hospitalist on call if Lindsay hospitalist that took care of you is not available. Once you are discharged, your primary care physician will handle any further medical issues. Please note that NO REFILLS for any discharge medications will be authorized once you are discharged, as it is imperative that you return to your primary care physician (or establish a relationship with a primary care  physician if you do not have one) for your aftercare needs so that they can reassess your need for medications and monitor your lab values.    Today   CHIEF COMPLAINT:   Chief Complaint  [name] presents with  . Drug Overdose    HISTORY OF PRESENT ILLNESS:  Lindsay [name]  is a 47 y.o. female came in with overdose with amitriptyline   VITAL SIGNS:  Blood pressure (!) 133/99, pulse 79, temperature 98 F (36.7 C), temperature source Oral, resp. rate (!) 24, height 5\' 8"  (1.727 m), weight 64.9 kg (143 lb), SpO2 99 %.    PHYSICAL EXAMINATION:  GENERAL:  47 y.o.-year-old [name] lying in Lindsay bed with no acute distress.  EYES: Pupils equal, round, reactive to light and accommodation. No scleral icterus. Extraocular muscles intact.  HEENT: Head atraumatic, normocephalic. Oropharynx and nasopharynx clear.  NECK:  Supple, no jugular venous distention. No thyroid enlargement, no tenderness.  LUNGS: Equal breath sounds bilaterally, no wheezing, rales,rhonchi or crepitation. No use of accessory muscles of respiration.  CARDIOVASCULAR: S1, S2 normal. No murmurs, rubs, or gallops.  ABDOMEN: Soft, non-tender, non-distended. Bowel sounds present. No organomegaly or mass.  EXTREMITIES: No pedal edema, cyanosis, or clubbing.  NEUROLOGIC: Cranial nerves II through XII are intact. Muscle strength 5/5 in all extremities. Sensation intact. Gait not checked.  No signs of tremor. PSYCHIATRIC: Lindsay [name] is alert and oriented x 3.  SKIN: No obvious rash, lesion, or ulcer.   DATA REVIEW:   CBC Recent Labs  Lab 11/13/17 0202  WBC 5.4  HGB 12.8  HCT 36.4  PLT 248    Chemistries  Recent Labs  Lab 11/13/17 0202 11/13/17 0433 11/14/17 0506  NA 141 142 136  K 4.4 4.4 3.9  CL 109 111 104  CO2 21* 22 27  GLUCOSE 96 100* 91  BUN 8 7 6   CREATININE 0.58 0.54 0.60  CALCIUM 8.7* 8.2* 8.4*  MG  --  2.1  --   AST 38  --   --   ALT 14  --   --   ALKPHOS 66  --   --   BILITOT 0.3  --   --       Microbiology Results  Results for orders placed or performed during Lindsay hospital encounter of 11/13/17  MRSA PCR Screening     Status: None   Collection Time: 11/13/17  4:06 AM  Result Value Ref Range Status   MRSA by PCR NEGATIVE NEGATIVE Final    Comment:        Lindsay GeneXpert MRSA Assay (FDA approved for NASAL specimens only), is one component of a comprehensive MRSA colonization surveillance program. It is not intended to diagnose MRSA infection nor to guide or monitor treatment for MRSA infections. Performed at [facility], [address], [place], [place] [address]     RADIOLOGY:  US Abdomen Limited Ruq  Result Date: 11/14/2017 CLINICAL DATA:  ETOH abuse. EXAM: ULTRASOUND ABDOMEN LIMITED RIGHT UPPER QUADRANT COMPARISON:  None. FINDINGS: Gallbladder: No gallstones or wall thickening visualized. No sonographic Murphy sign noted by sonographer. Common bile duct: Diameter: 5 mm Liver: Liver is diffusely echogenic indicating fatty infiltration. No suspicious mass or lesion identified in Lindsay liver. No convincing evidence of liver cirrhosis. Portal vein is patent on color Doppler imaging with normal direction of blood flow towards Lindsay liver. IMPRESSION: 1. No acute findings. 2. Fatty infiltration of Lindsay liver. No suspicious mass or lesion within Lindsay liver. No convincing evidence of cirrhosis. Electronically Signed   By: Bary Jaelyn Bourgoin M.D.   On: 11/14/2017 09:44     Management plans discussed with Lindsay [name], and she is in agreement.  CODE STATUS:     Code Status Orders  (From admission, onward)        Start     Ordered   11/13/17 0400  Full code  Continuous     11/13/17 0359    Code Status History    This [name] has a current code status but no historical code status.      TOTAL TIME TAKING CARE OF THIS [name]: 35 minutes.    Alford Highland M.D on 11/14/2017 at 2:53 PM  Between 7am to 6pm - Pager - [phone]  After 6pm go to [email or web address] -  password [employer]  [employer] Office  [phone]  CC: Primary care physician; [facility]

## 2017-11-14 NOTE — Clinical Social Work Note (Signed)
Clinical Social Work Assessment  Patient Details  Name: Lindsay [name] MRN: [id number] Date of Birth: [birth year]  Date of referral:  11/14/17               Reason for consult:  [employer], Ecologist sought to share information with:    Permission granted to share information::     Name::        Agency::     Relationship::     Contact Information:     Housing/Transportation Living arrangements for the past 2 months:  Single Family Home Source of Information:  Patient Patient Interpreter Needed:  None Criminal Activity/Legal Involvement Pertinent to Current Situation/Hospitalization:  No - Comment as needed Significant Relationships:  Significant Other Lives with:  Minor Children, Significant Other, Siblings Do you feel safe going back to the place where you live?  Yes Need for family participation in patient care:  Yes (Comment)  Care giving concerns:  Patient lives in [place] with her significant other and 29 yo and 28 yo sons.    Social Worker assessment / plan:  Holiday representative ([place]) received verbal consult from nurse that patient does not have a ride home today. CSW met with patient alone at bedside to address consult. Patient was alert and oriented X4 and was sitting up in the bed. CSW introduced self and explained role of CSW department. Per patient she lives in [place] and drives her own car for transportation usually. Patient reported that her boyfriend and her 2 sons ages 64 and 55 live with her. Patient reported that she does drink alcohol however she plans on stopping. Patient reported that his hospitalization was a wake up call. Patient denied drug use. Patient reported that she does not remember any phone numbers to call except her brother Lindsay [name]. Patient contacted Lindsay [name] while CSW was in the room. Per patient her brother Lindsay [name] is getting off work now and has to run a few errands and then will come pick her up. CSW provided  patient with outpatient substance abuse resources including [facility]. Per patient she has been to [facility] before and will likely go back there. Patient reported no other needs or concerns. RN and RN case manager aware of above. Please reconsult if future social work needs arise. CSW signing off.   Employment status:  Disabled (Comment on whether or not currently receiving Disability) Insurance information:  Medicaid [place] PT Recommendations:  Home with [place] / Referral to community resources:  Outpatient Substance Abuse Treatment Options  Patient/Family's Response to care:  Patient accepted resources.   Patient/Family's Understanding of and Emotional Response to Diagnosis, Current Treatment, and Prognosis:  Patient was pleasant and thanked CSW for visit.   Emotional Assessment Appearance:  Appears stated age Attitude/Demeanor/Rapport:    Affect (typically observed):  Accepting, Adaptable, Pleasant Orientation:  Oriented to Self, Oriented to  Time, Oriented to Place, Oriented to Situation Alcohol / Substance use:  Alcohol Use Psych involvement (Current and /or in the community):  Yes (Comment)  Discharge Needs  Concerns to be addressed:  No discharge needs identified Readmission within the last 30 days:  No Current discharge risk:  None Barriers to Discharge:  No Barriers Identified   Deana Krock, Veronia Beets, LCSW 11/14/2017, 3:05 PM

## 2017-11-14 NOTE — Evaluation (Signed)
Physical Therapy Evaluation Patient Details Name: Lindsay [name] MRN: [id number] DOB: August 22, 1970 Today's Date: 11/14/2017   History of Present Illness  47 y/o female here after amitriptyline overdose.  H/o ETOH abuse.  Clinical Impression  Pt with general weakness though she states she is feeling much better than yesterday but is still generally much weaker than her baseline.  She is able to ambulate with walker, with single UE use and with no AD all safely but with increasing levels of guardedness.  She feels as though she is safe to go home but is much weaker than her baseline and would benefit from HHPT to get back to PLOF.     Follow Up Recommendations Home health PT    Equipment Recommendations  None recommended by PT([facility])    Recommendations for Other Services       Precautions / Restrictions Precautions Precautions: Fall Restrictions Weight Bearing Restrictions: No      Mobility  Bed Mobility Overal bed mobility: Independent             General bed mobility comments: Pt able to get to sitting w/o assist  Transfers Overall transfer level: Independent Equipment used: Rolling walker (2 wheeled)             General transfer comment: Pt able to rise to standing with light UE use and good overall confidence  Ambulation/Gait Ambulation/Gait assistance: Supervision Gait Distance (Feet): 85 Feet Assistive device: Rolling walker (2 wheeled);1 person hand held assist;None       General Gait Details: Pt was able to ambulate with good confidence and overall did well.  She used the walker ~50 ft, hallway rail ~20 ft and another 15 ft w/o UE use.  She was somewhat slow and cautious with the effort but did not have any LOBs and overall did well.  HR did increase to 120 with the effort, she did not endorse a lot of acute fatigue but general weakness.  Stairs            Wheelchair Mobility    Modified Rankin (Stroke Patients Only)        Balance Overall balance assessment: Independent                                           Pertinent Vitals/Pain Pain Assessment: No/denies pain    Home Living Family/patient expects to be discharged to:: Private residence Living Arrangements: Spouse/significant other Available Help at Discharge: Family   Home Access: Stairs to enter Entrance Stairs-Rails: Right Entrance Stairs-Number of Steps: 4 Home Layout: One level        Prior Function Level of Independence: Independent(occasionally uses SPC 2/2 hip pain (OA?))               Hand Dominance        Extremity/Trunk Assessment   Upper Extremity Assessment Upper Extremity Assessment: Generalized weakness;Overall [facility]FL for tasks assessed    Lower Extremity Assessment Lower Extremity Assessment: Generalized weakness;Overall WFL for tasks assessed       Communication   Communication: No difficulties  Cognition Arousal/Alertness: Awake/alert Behavior During Therapy: WFL for tasks assessed/performed Overall Cognitive Status: Within Functional Limits for tasks assessed  General Comments      Exercises     Assessment/Plan    PT Assessment Patient needs continued PT services  PT Problem List Decreased strength;Decreased range of motion;Decreased activity tolerance;Decreased balance;Decreased mobility;Decreased coordination;Decreased cognition;Decreased safety awareness;Decreased knowledge of use of DME       PT Treatment Interventions DME instruction;Gait training;Therapeutic activities;Stair training;Functional mobility training;Therapeutic exercise;Balance training;Neuromuscular re-education;Patient/family education    PT Goals (Current goals can be found in the Care Plan section)  Acute Rehab PT Goals Patient Stated Goal: go home PT Goal Formulation: With patient Time For Goal Achievement: 11/28/17 Potential to Achieve  Goals: Good    Frequency Min 2X/week   Barriers to discharge        Co-evaluation               AM-PAC PT "6 Clicks" Daily Activity  Outcome Measure Difficulty turning over in bed (including adjusting bedclothes, sheets and blankets)?: None Difficulty moving from lying on back to sitting on the side of the bed? : None Difficulty sitting down on and standing up from a chair with arms (e.g., wheelchair, bedside commode, etc,.)?: None Help needed moving to and from a bed to chair (including a wheelchair)?: None Help needed walking in hospital room?: A Little Help needed climbing 3-5 steps with a railing? : A Little 6 Click Score: 22    End of Session Equipment Utilized During Treatment: Gait belt Activity Tolerance: Patient limited by fatigue;Patient tolerated treatment well Patient left: with call bell/phone within reach;with chair alarm set Nurse Communication: Mobility status PT Visit Diagnosis: Muscle weakness (generalized) (M62.81);Difficulty in walking, not elsewhere classified (R26.2)    Time: 1610-9604 PT Time Calculation (min) (ACUTE ONLY): 24 min   Charges:   PT Evaluation $PT Eval Low Complexity: 1 Low     PT G Codes:        Malachi Pro, DPT 11/14/2017, 1:32 PM

## 2017-11-14 NOTE — Discharge Instructions (Signed)
Alcohol Abuse and Nutrition °Alcohol abuse is any pattern of alcohol consumption that harms your health, relationships, or work. Alcohol abuse can affect how your body breaks down and absorbs nutrients from food by causing your liver to work abnormally. Additionally, many people who abuse alcohol do not eat enough carbohydrates, protein, fat, vitamins, and minerals. This can cause poor nutrition (malnutrition) and a lack of nutrients (nutrient deficiencies), which can lead to further complications. °Nutrients that are commonly lacking (deficient) among people who abuse alcohol include: °· Vitamins. °? Vitamin A. This is stored in your liver. It is important for your vision, metabolism, and ability to fight off infections (immunity). °? B vitamins. These include vitamins such as folate, thiamin, and niacin. These are important in new cell growth and maintenance. °? Vitamin C. This plays an important role in iron absorption, wound healing, and immunity. °? Vitamin D. This is produced by your liver, but you can also get vitamin D from food. Vitamin D is necessary for your body to absorb and use calcium. °· Minerals. °? Calcium. This is important for your bones and your heart and blood vessel (cardiovascular) function. °? Iron. This is important for blood, muscle, and nervous system functioning. °? Magnesium. This plays an important role in muscle and nerve function, and it helps to control blood sugar and blood pressure. °? Zinc. This is important for the normal function of your nervous system and digestive system (gastrointestinal tract). ° °Nutrition is an essential component of therapy for alcohol abuse. Your health care provider or dietitian will work with you to design a plan that can help restore nutrients to your body and prevent potential complications. °What is my plan? °Your dietitian may develop a specific diet plan that is based on your condition and any other complications you may have. A diet plan will  commonly include: °· A balanced diet. °? Grains: 6-8 oz per day. °? Vegetables: 2-3 cups per day. °? Fruits: 1-2 cups per day. °? Meat and other protein: 5-6 oz per day. °? Dairy: 2-3 cups per day. °· Vitamin and mineral supplements. ° °What do I need to know about alcohol and nutrition? °· Consume foods that are high in antioxidants, such as grapes, berries, nuts, green tea, and dark green and orange vegetables. This can help to counteract some of the stress that is placed on your liver by consuming alcohol. °· Avoid food and drinks that are high in fat and sugar. Foods such as sugared soft drinks, salty snack foods, and candy contain empty calories. This means that they lack important nutrients such as protein, fiber, and vitamins. °· Eat frequent meals and snacks. Try to eat 5-6 small meals each day. °· Eat a variety of fresh fruits and vegetables each day. This will help you get plenty of water, fiber, and vitamins in your diet. °· Drink plenty of water and other clear fluids. Try to drink at least 48-64 oz (1.5-2 L) of water per day. °· If you are a vegetarian, eat a variety of protein-rich foods. Pair whole grains with plant-based proteins at meals and snacks to obtain the greatest nutrient benefit from your food. For example, eat rice with beans, put peanut butter on whole-grain toast, or eat oatmeal with sunflower seeds. °· Soak beans and whole grains overnight before cooking. This can help your body to absorb the nutrients more easily. °· Include foods fortified with vitamins and minerals in your diet. Commonly fortified foods include milk, orange juice, cereal, and bread. °·   If you are malnourished, your dietitian may recommend a high-protein, high-calorie diet. This may include: ? 2,000-3,000 calories (kilocalories) per day. ? 70-100 grams of protein per day.  Your health care provider may recommend a complete nutritional supplement beverage. This can help to restore calories, protein, and vitamins to  your body. Depending on your condition, you may be advised to consume this instead of or in addition to meals.  Limit your intake of caffeine. Replace drinks like coffee and black tea with decaffeinated coffee and herbal tea.  Eat a variety of foods that are high in omega fatty acids. These include fish, nuts and seeds, and soybeans. These foods may help your liver to recover and may also stabilize your mood.  Certain medicines may cause changes in your appetite, taste, and weight. Work with your health care provider and dietitian to make any adjustments to your medicines and diet plan.  Include other healthy lifestyle choices in your daily routine. ? Be physically active. ? Get enough sleep. ? Spend time doing activities that you enjoy.  If you are unable to take in enough food and calories by mouth, your health care provider may recommend a feeding tube. This is a tube that passes through your nose and throat, directly into your stomach. Nutritional supplement beverages can be given to you through the feeding tube to help you get the nutrients you need.  Take vitamin or mineral supplements as recommended by your health care provider. What foods can I eat? Grains Enriched pasta. Enriched rice. Fortified whole-grain bread. Fortified whole-grain cereal. Barley. Brown rice. Quinoa. [place]. Vegetables All fresh, frozen, and canned vegetables. Spinach. Kale. Artichoke. Carrots. Winter squash and pumpkin. Sweet potatoes. Broccoli. Cabbage. Cucumbers. Tomatoes. Sweet peppers. Green beans. Peas. Corn. Fruits All fresh and frozen fruits. Berries. Grapes. Mango. Papaya. Guava. Cherries. Apples. Bananas. Peaches. Plums. Pineapple. Watermelon. Cantaloupe. Oranges. Avocado. Meats and Other Protein Sources Beef liver. Lean beef. Pork. Fresh and canned chicken. Fresh fish. Oysters. Sardines. Canned tuna. Shrimp. Eggs with yolks. Nuts and seeds. Peanut butter. Beans and lentils. Soybeans.  Tofu. Dairy Whole, low-fat, and nonfat milk. Whole, low-fat, and nonfat yogurt. Cottage cheese. Sour cream. Hard and soft cheeses. Beverages Water. Herbal tea. Decaffeinated coffee. Decaffeinated green tea. 100% fruit juice. 100% vegetable juice. Instant breakfast shakes. Condiments Ketchup. Mayonnaise. Mustard. Salad dressing. Barbecue sauce. Sweets and Desserts Sugar-free ice cream. Sugar-free pudding. Sugar-free gelatin. Fats and Oils Butter. Vegetable oil, flaxseed oil, olive oil, and walnut oil. Other Complete nutrition shakes. Protein bars. Sugar-free gum. The items listed above may not be a complete list of recommended foods or beverages. Contact your dietitian for more options. What foods are not recommended? Grains Sugar-sweetened breakfast cereals. Flavored instant oatmeal. Fried breads. Vegetables Breaded or deep-fried vegetables. Fruits Dried fruit with added sugar. Candied fruit. Canned fruit in syrup. Meats and Other Protein Sources Breaded or deep-fried meats. Dairy Flavored milks. Fried cheese curds or fried cheese sticks. Beverages Alcohol. Sugar-sweetened soft drinks. Sugar-sweetened tea. Caffeinated coffee and tea. Condiments Sugar. Honey. Agave nectar. Molasses. Sweets and Desserts Chocolate. Cake. Cookies. Candy. Other Potato chips. Pretzels. Salted nuts. Candied nuts. The items listed above may not be a complete list of foods and beverages to avoid. Contact your dietitian for more information. This information is not intended to replace advice given to you by your health care provider. Make sure you discuss any questions you have with your health care provider. Document Released: 02/15/2005 Document Revised: 08/31/2015 Document Reviewed: 11/24/2013 Elsevier Interactive Patient Education  2018 [employer]  Alcohol Abuse and Nutrition Alcohol abuse is any pattern of alcohol consumption that harms your health, relationships, or work. Alcohol abuse can  affect how your body breaks down and absorbs nutrients from food by causing your liver to work abnormally. Additionally, many people who abuse alcohol do not eat enough carbohydrates, protein, fat, vitamins, and minerals. This can cause poor nutrition (malnutrition) and a lack of nutrients (nutrient deficiencies), which can lead to further complications. Nutrients that are commonly lacking (deficient) among people who abuse alcohol include:  Vitamins. ? Vitamin A. This is stored in your liver. It is important for your vision, metabolism, and ability to fight off infections (immunity). ? B vitamins. These include vitamins such as folate, thiamin, and niacin. These are important in new cell growth and maintenance. ? Vitamin C. This plays an important role in iron absorption, wound healing, and immunity. ? Vitamin D. This is produced by your liver, but you can also get vitamin D from food. Vitamin D is necessary for your body to absorb and use calcium.  Minerals. ? Calcium. This is important for your bones and your heart and blood vessel (cardiovascular) function. ? Iron. This is important for blood, muscle, and nervous system functioning. ? Magnesium. This plays an important role in muscle and nerve function, and it helps to control blood sugar and blood pressure. ? Zinc. This is important for the normal function of your nervous system and digestive system (gastrointestinal tract).  Nutrition is an essential component of therapy for alcohol abuse. Your health care provider or dietitian will work with you to design a plan that can help restore nutrients to your body and prevent potential complications. What is my plan? Your dietitian may develop a specific diet plan that is based on your condition and any other complications you may have. A diet plan will commonly include:  A balanced diet. ? Grains: 6-8 oz per day. ? Vegetables: 2-3 cups per day. ? Fruits: 1-2 cups per day. ? Meat and other  protein: 5-6 oz per day. ? Dairy: 2-3 cups per day.  Vitamin and mineral supplements.  What do I need to know about alcohol and nutrition?  Consume foods that are high in antioxidants, such as grapes, berries, nuts, green tea, and dark green and orange vegetables. This can help to counteract some of the stress that is placed on your liver by consuming alcohol.  Avoid food and drinks that are high in fat and sugar. Foods such as sugared soft drinks, salty snack foods, and candy contain empty calories. This means that they lack important nutrients such as protein, fiber, and vitamins.  Eat frequent meals and snacks. Try to eat 5-6 small meals each day.  Eat a variety of fresh fruits and vegetables each day. This will help you get plenty of water, fiber, and vitamins in your diet.  Drink plenty of water and other clear fluids. Try to drink at least 48-64 oz (1.5-2 L) of water per day.  If you are a vegetarian, eat a variety of protein-rich foods. Pair whole grains with plant-based proteins at meals and snacks to obtain the greatest nutrient benefit from your food. For example, eat rice with beans, put peanut butter on whole-grain toast, or eat oatmeal with sunflower seeds.  Soak beans and whole grains overnight before cooking. This can help your body to absorb the nutrients more easily.  Include foods fortified with vitamins and minerals in your diet. Commonly fortified foods include milk, orange  juice, cereal, and bread.  If you are malnourished, your dietitian may recommend a high-protein, high-calorie diet. This may include: ? 2,000-3,000 calories (kilocalories) per day. ? 70-100 grams of protein per day.  Your health care provider may recommend a complete nutritional supplement beverage. This can help to restore calories, protein, and vitamins to your body. Depending on your condition, you may be advised to consume this instead of or in addition to meals.  Limit your intake of caffeine.  Replace drinks like coffee and black tea with decaffeinated coffee and herbal tea.  Eat a variety of foods that are high in omega fatty acids. These include fish, nuts and seeds, and soybeans. These foods may help your liver to recover and may also stabilize your mood.  Certain medicines may cause changes in your appetite, taste, and weight. Work with your health care provider and dietitian to make any adjustments to your medicines and diet plan.  Include other healthy lifestyle choices in your daily routine. ? Be physically active. ? Get enough sleep. ? Spend time doing activities that you enjoy.  If you are unable to take in enough food and calories by mouth, your health care provider may recommend a feeding tube. This is a tube that passes through your nose and throat, directly into your stomach. Nutritional supplement beverages can be given to you through the feeding tube to help you get the nutrients you need.  Take vitamin or mineral supplements as recommended by your health care provider. What foods can I eat? Grains Enriched pasta. Enriched rice. Fortified whole-grain bread. Fortified whole-grain cereal. Barley. Brown rice. Quinoa. [place]. Vegetables All fresh, frozen, and canned vegetables. Spinach. Kale. Artichoke. Carrots. Winter squash and pumpkin. Sweet potatoes. Broccoli. Cabbage. Cucumbers. Tomatoes. Sweet peppers. Green beans. Peas. Corn. Fruits All fresh and frozen fruits. Berries. Grapes. Mango. Papaya. Guava. Cherries. Apples. Bananas. Peaches. Plums. Pineapple. Watermelon. Cantaloupe. Oranges. Avocado. Meats and Other Protein Sources Beef liver. Lean beef. Pork. Fresh and canned chicken. Fresh fish. Oysters. Sardines. Canned tuna. Shrimp. Eggs with yolks. Nuts and seeds. Peanut butter. Beans and lentils. Soybeans. Tofu. Dairy Whole, low-fat, and nonfat milk. Whole, low-fat, and nonfat yogurt. Cottage cheese. Sour cream. Hard and soft cheeses. Beverages Water. Herbal tea.  Decaffeinated coffee. Decaffeinated green tea. 100% fruit juice. 100% vegetable juice. Instant breakfast shakes. Condiments Ketchup. Mayonnaise. Mustard. Salad dressing. Barbecue sauce. Sweets and Desserts Sugar-free ice cream. Sugar-free pudding. Sugar-free gelatin. Fats and Oils Butter. Vegetable oil, flaxseed oil, olive oil, and walnut oil. Other Complete nutrition shakes. Protein bars. Sugar-free gum. The items listed above may not be a complete list of recommended foods or beverages. Contact your dietitian for more options. What foods are not recommended? Grains Sugar-sweetened breakfast cereals. Flavored instant oatmeal. Fried breads. Vegetables Breaded or deep-fried vegetables. Fruits Dried fruit with added sugar. Candied fruit. Canned fruit in syrup. Meats and Other Protein Sources Breaded or deep-fried meats. Dairy Flavored milks. Fried cheese curds or fried cheese sticks. Beverages Alcohol. Sugar-sweetened soft drinks. Sugar-sweetened tea. Caffeinated coffee and tea. Condiments Sugar. Honey. Agave nectar. Molasses. Sweets and Desserts Chocolate. Cake. Cookies. Candy. Other Potato chips. Pretzels. Salted nuts. Candied nuts. The items listed above may not be a complete list of foods and beverages to avoid. Contact your dietitian for more information. This information is not intended to replace advice given to you by your health care provider. Make sure you discuss any questions you have with your health care provider. Document Released: 02/15/2005 Document Revised: 08/31/2015 Document Reviewed: 11/24/2013  Elsevier Interactive Patient Education  [employer].   Alcohol Use Disorder Alcohol use disorder is when your drinking disrupts your daily life. When you have this condition, you drink too much alcohol and you cannot control your drinking. Alcohol use disorder can cause serious problems with your physical health. It can affect your brain, heart, liver, pancreas,  immune system, stomach, and intestines. Alcohol use disorder can increase your risk for certain cancers and cause problems with your mental health, such as depression, anxiety, psychosis, delirium, and dementia. People with this disorder risk hurting themselves and others. What are the causes? This condition is caused by drinking too much alcohol over time. It is not caused by drinking too much alcohol only one or two times. Some people with this condition drink alcohol to cope with or escape from negative life events. Others drink to relieve pain or symptoms of mental illness. What increases the risk? You are more likely to develop this condition if:  You have a family history of alcohol use disorder.  Your culture encourages drinking to the point of intoxication, or makes alcohol easy to get.  You had a mood or conduct disorder in childhood.  You have been a victim of abuse.  You are an adolescent and: ? You have poor grades or difficulties in school. ? Your caregivers do not talk to you about saying no to alcohol, or supervise your activities. ? You are impulsive or you have trouble with self-control.  What are the signs or symptoms? Symptoms of this condition include:  Drinkingmore than you want to.  Drinking for longer than you want to.  Trying several times to drink less or to control your drinking.  Spending a lot of time getting alcohol, drinking, or recovering from drinking.  Craving alcohol.  Having problems at work, at school, or at home due to drinking.  Having problems in relationships due to drinking.  Drinking when it is dangerous to drink, such as before driving a car.  Continuing to drink even though you know you might have a physical or mental problem related to drinking.  Needing more and more alcohol to get the same effect you want from the alcohol (building up tolerance).  Having symptoms of withdrawal when you stop drinking. Symptoms of withdrawal  include: ? Fatigue. ? Nightmares. ? Trouble sleeping. ? Depression. ? Anxiety. ? Fever. ? Seizures. ? Severe confusion. ? Feeling or seeing things that are not there (hallucinations). ? Tremors. ? Rapid heart rate. ? Rapid breathing. ? High blood pressure.  Drinking to avoid symptoms of withdrawal.  How is this diagnosed? This condition is diagnosed with an assessment. Your health care provider may start the assessment by asking three or four questions about your drinking. Your health care provider may perform a physical exam or do lab tests to see if you have physical problems resulting from alcohol use. She or he may refer you to a mental health professional for evaluation. How is this treated? Some people with alcohol use disorder are able to reduce their alcohol use to low-risk levels. Others need to completely quit drinking alcohol. When necessary, mental health professionals with specialized training in substance use treatment can help. Your health care provider can help you decide how severe your alcohol use disorder is and what type of treatment you need. The following forms of treatment are available:  Detoxification. Detoxification involves quitting drinking and using prescription medicines within the first week to help lessen withdrawal symptoms. This treatment is  important for people who have had withdrawal symptoms before and for heavy drinkers who are likely to have withdrawal symptoms. Alcohol withdrawal can be dangerous, and in severe cases, it can cause death. Detoxification may be provided in a home, community, or primary care setting, or in a hospital or substance use treatment facility.  Counseling. This treatment is also called talk therapy. It is provided by substance use treatment counselors. A counselor can address the reasons you use alcohol and suggest ways to keep you from drinking again or to prevent problem drinking. The goals of talk therapy are to: ? Find  healthy activities and ways for you to cope with stress. ? Identify and avoid the things that trigger your alcohol use. ? Help you learn how to handle cravings.  Medicines.Medicines can help treat alcohol use disorder by: ? Decreasing alcohol cravings. ? Decreasing the positive feeling you have when you drink alcohol. ? Causing an uncomfortable physical reaction when you drink alcohol (aversion therapy).  Support groups. Support groups are led by people who have quit drinking. They provide emotional support, advice, and guidance.  These forms of treatment are often combined. Some people with this condition benefit from a combination of treatments provided by specialized substance use treatment centers. Follow these instructions at home:  Take over-the-counter and prescription medicines only as told by your health care provider.  Check with your health care provider before starting any new medicines.  Ask friends and family members not to offer you alcohol.  Avoid situations where alcohol is served, including gatherings where others are drinking alcohol.  Create a plan for what to do when you are tempted to use alcohol.  Find hobbies or activities that you enjoy that do not include alcohol.  Keep all follow-up visits as told by your health care provider. This is important. How is this prevented?  If you drink, limit alcohol intake to no more than 1 drink a day for nonpregnant women and 2 drinks a day for men. One drink equals 12 oz of beer, 5 oz of wine, or 1 oz of hard liquor.  If you have a mental health condition, get treatment and support.  Do not give alcohol to adolescents.  If you are an adolescent: ? Do not drink alcohol. ? Do not be afraid to say no if someone offers you alcohol. Speak up about why you do not want to drink. You can be a positive role model for your friends and set a good example for those around you by not drinking alcohol. ? If your friends drink,  spend time with others who do not drink alcohol. Make new friends who do not use alcohol. ? Find healthy ways to manage stress and emotions, such as meditation or deep breathing, exercise, spending time in nature, listening to music, or talking with a trusted friend or family member. Contact a health care provider if:  You are not able to take your medicines as told.  Your symptoms get worse.  You return to drinking alcohol (relapse) and your symptoms get worse. Get help right away if:  You have thoughts about hurting yourself or others. If you ever feel like you may hurt yourself or others, or have thoughts about taking your own life, get help right away. You can go to your nearest emergency department or call:  Your local emergency services (911 in the [place]  A suicide crisis helpline, such as the National Suicide Prevention Lifeline at (626)218-6344. This is open 24 hours  a day.  Summary  Alcohol use disorder is when your drinking disrupts your daily life. When you have this condition, you drink too much alcohol and you cannot control your drinking.  Treatment may include detoxification, counseling, medicine, and support groups.  Ask friends and family members not to offer you alcohol. Avoid situations where alcohol is served.  Get help right away if you have thoughts about hurting yourself or others. This information is not intended to replace advice given to you by your health care provider. Make sure you discuss any questions you have with your health care provider. Document Released: 05/31/2004 Document Revised: 01/19/2016 Document Reviewed: 01/19/2016 Elsevier Interactive Patient Education  [employer].   Drug Overdose A drug overdose happens when you take too much of a drug. An overdose can occur with illegal drugs, prescription drugs, or over-the-counter (OTC) drugs. The effects of a drug overdose can be mild, dangerous, or even deadly. What are the causes? This  condition may be caused by:  Taking too much of a drug by accident.  Taking too much of a drug on purpose.  An error made by a health care provider who prescribes a drug.  An error made by the pharmacist who fills the prescription order.  Drugs that commonly cause overdose include:  Mental health drugs.  Pain medicines.  Illegal drugs.  OTC cough and cold medicines.  Heart medicines.  Seizure medicines.  What increases the risk? A drug overdose is more likely in:  Children. They may be attracted to colorful pills. Because of a child's small size, even a small amount of a drug can be dangerous.  Elderly people. They may be taking many different drugs. Elderly people may have difficulty reading labels or remembering when they last took their medicine.  The risk of a drug overdose is also higher for someone who:  Takes illegal drugs.  Takes a drug and drinks alcohol.  Has a mental health condition.  What are the signs or symptoms? Symptoms of a drug overdose depend on the drug and the amount that was taken. Common danger signs include:  Behavior changes.  Sleepiness.  Slowed breathing.  Nausea and vomiting.  Seizures.  Changes in eye pupil size. The pupil may be very large or very small.  If there are signs and symptoms of very low blood pressure (shock) from an overdose, emergency treatment is required. These include:  Cold and clammy skin.  Pale skin.  Blue lips.  Very slow breathing.  Extreme sleepiness.  Loss of consciousness.  How is this diagnosed? This condition may be diagnosed based on your symptoms. It is important to tell your health care provider:  All of the drugs that you took.  When you took the drugs.  Whether you were drinking alcohol.  Your health care provider will do a physical exam. This exam may include:  Checking and monitoring your heart rate and rhythm, your temperature, and your blood pressure (vital  signs).  Checking your breathing and oxygen level.  You may also have tests, including:  Urine tests to check for drugs in your system.  Blood tests to check for: ? Drugs in your system. ? Signs of an imbalance of your blood minerals (electrolytes). ? Liver damage. ? Kidney damage.  How is this treated? Supporting your vital signs and your breathing is the first step in treating a drug overdose. Treatment may also include:  Giving fluids and electrolytes through an IV tube.  Inserting a breathing tube (  endotracheal tube) in your airway to help you breathe.  Passing a tube through your nose and into your stomach (NG tube, or nasogastric tube) to wash out your stomach.  Giving medicines that: ? Make you vomit. ? Absorb any medicine that is left in your digestive system. ? Block or reverse the effect of the drug that caused the overdose.  Filtering your blood through an artificial kidney machine (hemodialysis). You may need this if your overdose is severe or if you have kidney failure.  Ongoing counseling and mental health support if you intentionally overdosed or used an illegal drug.  Follow these instructions at home:  Take medicines only as directed by your health care provider. Always ask your health care provider about possible side effects of any new drug that you start taking.  Keep a list of all of the drugs that you take, including over-the-counter medicines. Bring this list with you to all of your medical visits.  Drink enough fluid to keep your urine clear or pale yellow.  Keep all follow-up visits as directed by your health care provider. This is important. How is this prevented?  Get help if you are struggling with: ? Alcohol or drug use. ? Depression or another mental health problem.  Keep the phone number of your local poison control center near your phone or on your cell phone.  Store all medicines in safety containers that are out of the reach of  children.  Read the drug inserts that come with your medicines.  Do not use illegal drugs.  Do not drink alcohol when taking drugs.  Do not take medicines that are not prescribed for you. Contact a health care provider if:  Your symptoms return.  You develop new symptoms or side effects when you take medicines. Get help right away if:  You think that you or someone else may have taken too much of a drug. The hotline of the [facility] is (573)229-0203.  You or someone else is having symptoms of a drug overdose.  You have serious thoughts about hurting yourself or others.  You become confused.  You have: ? Chest pain. ? Difficulty breathing. ? A loss of consciousness. Drug overdose is an emergency. Do not wait to see if the symptoms will go away. Get medical help right away. Call your local emergency services (911 in the [place] Do not drive yourself to the hospital. This information is not intended to replace advice given to you by your health care provider. Make sure you discuss any questions you have with your health care provider. Document Released: 09/07/2014 Document Revised: 09/02/2015 Document Reviewed: 04/28/2014 Elsevier Interactive Patient Education  [employer].

## 2017-11-14 NOTE — Progress Notes (Signed)
Pt stable for d/c home today per MD. Dan HumphreysWalker delivered to pt's room. PIV removed. Reviewed discharge instructions and prescriptions with pt, all questions answered. Assisted to car via nursing staff.   CliftonHudson, Latricia HeftKorie G

## 2017-11-14 NOTE — Care Management Note (Signed)
Case Management Note  Patient Details  Name: Lindsay [name] MRN: [id number] Date of Birth: 1971/04/20   Patient admitted for  Acute encephalopathy and amitriptyline overdose.  Patient lives at home with significant other and 2 teenage children.  Patient states that she has active Medicaid, however does not have a provider she sees, and does not know who she is assigned too.  RNCM contacted Medicaid office and patient is assigned to Phineas Realharles Drew.  RNCM made new patient appointment for 12/04/17.  PT has assessed patient and recommends Home health PT. Patient agreeable to home health services and does not have preference of agency.  [facility] Dr. Judithann SheenSparks physician advisor has agreed  To sign orders until patient gets in with her PCP.  Patient understands that if she does not follow up with PCP, then her services will discontinue.   Patient does not know her phone number that works, but states she has a working number.  Brad with [facility] has delivered RW to the room, and patient has agreed to call [facility] and provide them with a working number.  Patient will not be open until she has provided a working number.  RNCM signing off.   Subjective/Objective:                    Action/Plan:   Expected Discharge Date:  11/14/17               Expected Discharge Plan:  Home w Home Health Services  In-House Referral:     Discharge planning Services  CM Consult  Post Acute Care Choice:  Home Health, Durable Medical Equipment Choice offered to:  Patient  DME Arranged:  Walker rolling DME Agency:  [facility]  HH Arr[facility]  [facility]  Status of Service:  Completed, signed off  If discussed at Long Length of Stay Meetings, dates discussed:    Additional Comments:  Chapman FitchBOWEN, [name], RN 11/14/2017, 3:40 PM

## 2017-11-27 ENCOUNTER — Encounter: Payer: Self-pay | Admitting: Emergency Medicine

## 2017-11-27 ENCOUNTER — Other Ambulatory Visit: Payer: Self-pay

## 2017-11-27 ENCOUNTER — Ambulatory Visit
Admission: EM | Admit: 2017-11-27 | Discharge: 2017-11-27 | Disposition: A | Discharge status: Home / Self Care | Payer: Medicaid Other

## 2017-11-27 NOTE — ED Triage Notes (Signed)
Pt here c/o insect sting/bite under her right eye. She thinks it was as yellow jacket. eye is swollen, painful  and red. Pt has had a 40 oz beer today.

## 2018-01-14 ENCOUNTER — Other Ambulatory Visit: Payer: Self-pay

## 2018-01-14 ENCOUNTER — Encounter: Payer: Self-pay | Admitting: Emergency Medicine

## 2018-01-14 ENCOUNTER — Emergency Department
Admission: EM | Admit: 2018-01-14 | Discharge: 2018-01-15 | Disposition: A | Discharge status: Home / Self Care | Payer: Medicaid Other | Attending: Emergency Medicine | Admitting: Emergency Medicine

## 2018-01-14 DIAGNOSIS — Z79899 Other long term (current) drug therapy: Secondary | ICD-10-CM | POA: Diagnosis not present

## 2018-01-14 DIAGNOSIS — F17210 Nicotine dependence, cigarettes, uncomplicated: Secondary | ICD-10-CM | POA: Insufficient documentation

## 2018-01-14 DIAGNOSIS — F10921 Alcohol use, unspecified with intoxication delirium: Secondary | ICD-10-CM

## 2018-01-14 DIAGNOSIS — F10929 Alcohol use, unspecified with intoxication, unspecified: Secondary | ICD-10-CM | POA: Insufficient documentation

## 2018-01-14 MED ORDER — LORAZEPAM 2 MG/ML IJ SOLN: 0.0000 mg | Freq: Two times a day (BID) | INTRAMUSCULAR | Status: DC

## 2018-01-14 MED ORDER — LORAZEPAM 2 MG PO TABS: 0.0000 mg | ORAL_TABLET | Freq: Four times a day (QID) | ORAL | Status: DC

## 2018-01-14 MED ORDER — LORAZEPAM 2 MG PO TABS: 0.0000 mg | ORAL_TABLET | Freq: Two times a day (BID) | ORAL | Status: DC

## 2018-01-14 MED ORDER — THIAMINE HCL 100 MG/ML IJ SOLN: 100.0000 mg | Freq: Every day | INTRAMUSCULAR | Status: DC

## 2018-01-14 MED ORDER — VITAMIN B-1 100 MG PO TABS
100.0000 mg | ORAL_TABLET | Freq: Every day | ORAL | Status: DC
  Administered 2018-01-15: 100 mg via ORAL
  Filled 2018-01-14: qty 1

## 2018-01-14 MED ORDER — LORAZEPAM 2 MG/ML IJ SOLN: 0.0000 mg | Freq: Four times a day (QID) | INTRAMUSCULAR | Status: DC

## 2018-01-14 NOTE — ED Provider Notes (Signed)
[facility] Emergency Department Provider Note    First MD Initiated Contact with Patient 01/14/18 2335     (approximate)  I have reviewed the triage vital signs and the nursing notes.   HISTORY  Chief Complaint No chief complaint on file.    HPI Lindsay Price is a 47 y.o. female below list of chronic medical conditions including alcoholism and previous medication overdose presents to the emergency department via EMS.  Patient states that she thinks that her children called EMS because she is intoxicated.  Patient denies any suicidal or homicidal ID patient does admit to drinking 340 ounce beers today.  Patient states she usually drinks between 3 to 640 ounces of beer a day  Past Medical History:  Diagnosis Date  . Alcoholism [facility])     Patient Active Problem List   Diagnosis Date Noted  . Amitriptyline overdose of undetermined intent 11/13/2017  . Alcohol abuse 11/13/2017  . Substance induced mood disorder (HCC) 11/13/2017    Past Surgical History:  Procedure Laterality Date  . NO PAST SURGERIES      Prior to Admission medications   Medication Sig Start Date End Date Taking? Authorizing Provider  amLODipine (NORVASC) 10 MG tablet Take 1 tablet (10 mg total) by mouth daily. 11/15/17   Alford Highland, MD  chlordiazePOXIDE (LIBRIUM) 25 MG capsule Take 1 capsule (25 mg total) by mouth every morning. 11/15/17   Alford Highland, MD  folic acid (FOLVITE) 1 MG tablet Take 1 tablet (1 mg total) by mouth daily. 11/15/17   Alford Highland, MD  Multiple Vitamin (MULTIVITAMIN WITH MINERALS) TABS tablet Take 1 tablet by mouth daily. 11/15/17   Alford Highland, MD  thiamine 100 MG tablet Take 1 tablet (100 mg total) by mouth daily. 11/15/17   Alford Highland, MD    Allergies No known drug allergies No family history on file.  Social History Social History   Tobacco Use  . Smoking status: Current Every Day Smoker    Packs/day: 0.50    Types:  Cigarettes  . Smokeless tobacco: Never Used  Substance Use Topics  . Alcohol use: Yes    Alcohol/week: 98.0 standard drinks    Types: 28 Cans of beer, 70 Shots of liquor per week  . Drug use: Not Currently    Types: Marijuana    Review of Systems Constitutional: No fever/chills Eyes: No visual changes. ENT: No sore throat. Cardiovascular: Denies chest pain. Respiratory: Denies shortness of breath. Gastrointestinal: No abdominal pain.  No nausea, no vomiting.  No diarrhea.  No constipation. Genitourinary: Negative for dysuria. Musculoskeletal: Negative for neck pain.  Negative for back pain. Integumentary: Negative for rash. Neurological: Negative for headaches, focal weakness or numbness. Psychiatric: Positive for alcohol intoxication  ____________________________________________   PHYSICAL EXAM:  VITAL SIGNS: ED Triage Vitals  Enc Vitals Group     BP 01/14/18 2320 120/87     Pulse Rate 01/14/18 2320 (!) 106     Resp --      Temp 01/14/18 2320 98.4 F (36.9 C)     Temp Source 01/14/18 2320 Oral     SpO2 01/14/18 2320 95 %     Weight --      Height --      Head Circumference --      Peak Flow --      Pain Score 01/14/18 2321 0     Pain Loc --      Pain Edu? --  Excl. in GC? --     Constitutional: Alert and oriented.  Appears intoxicated. Eyes: Conjunctivae are normal. Head: Atraumatic. Mouth/Throat: Mucous membranes are moist.  Oropharynx non-erythematous. Neck: No stridor.  Cardiovascular: Normal rate, regular rhythm. Good peripheral circulation. Grossly normal heart sounds. Respiratory: Normal respiratory effort.  No retractions. Lungs CTAB. Gastrointestinal: Soft and nontender. No distention.  Musculoskeletal: No lower extremity tenderness nor edema. No gross deformities of extremities. Neurologic:  Normal speech and language. No gross focal neurologic deficits are appreciated.  Skin:  Skin is warm, dry and intact. No rash noted. Psychiatric: Appears  intoxicated, agitated.  ____________________________________________   LABS (all labs ordered are listed, but only abnormal results are displayed)  Labs Reviewed  CBC - Abnormal; Notable for the following components:      Result Value   RBC 3.49 (*)    MCV 109.0 (*)    MCH 37.3 (*)    All other components within normal limits  ETHANOL - Abnormal; Notable for the following components:   Alcohol, Ethyl (B) 290 (*)    All other components within normal limits  URINE DRUG SCREEN, QUALITATIVE ([facility] ONLY) - Abnormal; Notable for the following components:   Tricyclic, Ur Screen POSITIVE (*)    Cannabinoid 50 Ng, Ur [place] POSITIVE (*)    All other components within normal limits  COMPREHENSIVE METABOLIC PANEL  PREGNANCY, URINE   ____________________________________________    Procedures   ____________________________________________   INITIAL IMPRESSION / ASSESSMENT AND PLAN / ED COURSE  As part of my medical decision making, I reviewed the following data within the electronic MEDICAL RECORD NUMBER   47 year old female presenting with above-stated history and physical exam consistent with acute alcohol intoxication.  Patient denies any suicidal or homicidal ideation.  Patient attempted to leave the emergency department given the fact that the patient is a risk to herself and others patient involuntarily committed.     ____________________________________________  FINAL CLINICAL IMPRESSION(S) / ED DIAGNOSES  Final diagnoses:  Alcohol intoxication with delirium (HCC)     MEDICATIONS GIVEN DURING THIS VISIT:  Medications  LORazepam (ATIVAN) injection 0-4 mg (0 mg Intravenous Not Given 01/15/18 0602)    Or  LORazepam (ATIVAN) tablet 0-4 mg ( Oral See Alternative 01/15/18 0602)  LORazepam (ATIVAN) injection 0-4 mg (has no administration in time range)    Or  LORazepam (ATIVAN) tablet 0-4 mg (has no administration in time range)  thiamine (VITAMIN B-1) tablet 100 mg (has no  administration in time range)    Or  thiamine (B-1) injection 100 mg (has no administration in time range)     ED Discharge Orders    None       Note:  This document was prepared using Dragon voice recognition software and may include unintentional dictation errors.    Darci Current, MD 01/15/18 [phone]

## 2018-01-15 LAB — COMPREHENSIVE METABOLIC PANEL
ALT: 13 U/L (ref 0–44)
ANION GAP: 7 (ref 5–15)
AST: 29 U/L (ref 15–41)
Albumin: 3.9 g/dL (ref 3.5–5.0)
Alkaline Phosphatase: 57 U/L (ref 38–126)
BUN: 8 mg/dL (ref 6–20)
CHLORIDE: 110 mmol/L (ref 98–111)
CO2: 25 mmol/L (ref 22–32)
CREATININE: 0.66 mg/dL (ref 0.44–1.00)
Calcium: 9 mg/dL (ref 8.9–10.3)
Glucose, Bld: 97 mg/dL (ref 70–99)
POTASSIUM: 4 mmol/L (ref 3.5–5.1)
SODIUM: 142 mmol/L (ref 135–145)
Total Bilirubin: 0.4 mg/dL (ref 0.3–1.2)
Total Protein: 7.8 g/dL (ref 6.5–8.1)

## 2018-01-15 LAB — URINE DRUG SCREEN, QUALITATIVE (ARMC ONLY)
AMPHETAMINES, UR SCREEN: NOT DETECTED
Barbiturates, Ur Screen: NOT DETECTED
Benzodiazepine, Ur Scrn: NOT DETECTED
CANNABINOID 50 NG, UR ~~LOC~~: POSITIVE — AB
Cocaine Metabolite,Ur ~~LOC~~: NOT DETECTED
MDMA (ECSTASY) UR SCREEN: NOT DETECTED
METHADONE SCREEN, URINE: NOT DETECTED
Opiate, Ur Screen: NOT DETECTED
Phencyclidine (PCP) Ur S: NOT DETECTED
TRICYCLIC, UR SCREEN: POSITIVE — AB

## 2018-01-15 LAB — PREGNANCY, URINE: PREG TEST UR: NEGATIVE

## 2018-01-15 LAB — CBC
HCT: 38 % (ref 35.0–47.0)
Hemoglobin: 13 g/dL (ref 12.0–16.0)
MCH: 37.3 pg — AB (ref 26.0–34.0)
MCHC: 34.2 g/dL (ref 32.0–36.0)
MCV: 109 fL — AB (ref 80.0–100.0)
PLATELETS: 283 10*3/uL (ref 150–440)
RBC: 3.49 MIL/uL — ABNORMAL LOW (ref 3.80–5.20)
RDW: 13.5 % (ref 11.5–14.5)
WBC: 5.1 10*3/uL (ref 3.6–11.0)

## 2018-01-15 LAB — ETHANOL: Alcohol, Ethyl (B): 290 mg/dL — ABNORMAL HIGH (ref <10)

## 2018-01-15 NOTE — ED Notes (Signed)
Patient escorted to lobby by Suszanne Conners

## 2018-01-15 NOTE — BH Assessment (Signed)
Discussed patient with ER MD (Dr. Silverio Lay) and patient is able to discharge home when medically cleared. Patient was giving referral information and instructions on how to follow up with Outpatient Treatment ([facility]) and [facility].  Patient denies SI/HI and AV/H.

## 2018-01-15 NOTE — ED Notes (Addendum)
Pt dressed out in burgundy scrubs by this tech and Sue Lush, Charity fundraiser. Pt belongings contained: 1 pair of shoes, 1 bra, 1 shirt, 1 grey colored pants, 2 hair ties, a lighter, (2) $1 bills, crazy glue, 1 silver cross with black necklace. Belongings placed in belongings bag with pt name on them and secured.

## 2018-01-15 NOTE — ED Provider Notes (Signed)
  Physical Exam  BP (!) 131/99 (BP Location: Right Arm)   Pulse 71   Temp 98.3 F (36.8 C) (Oral)   Resp 18   LMP 08/16/2016   SpO2 97%   Physical Exam  ED Course/Procedures     Procedures  MDM  Patient here with alcohol intoxication. Had some vague suicidal ideations then. But denies it currently. Psych counselor saw patient and recommend discharge. Denies any suicidal or homicidal ideations right now. Clinically sober. Given list of resources, stable for discharge       Charlynne Pander, MD 01/15/18 1132

## 2018-01-15 NOTE — ED Notes (Signed)
Patient alert, oriented and verbal. Patient denies SI/HI and A/V hallucinations. Patient is calm and cooperative. Patient states she is here because of her children and her drinking alcohol yesterday. Patient with Q 15 minute checks in progress and remains safe on unit. Monitoring continues.

## 2018-01-15 NOTE — Discharge Instructions (Addendum)
Avoid drinking alcohol   See your doctor  Return to ER if you have thoughts of harming yourself or others, hallucinations, seizures

## 2018-01-15 NOTE — ED Notes (Addendum)
Patient up to use the restroom at this time. This EDT provided patient with water as requested. Will continue to monitor.

## 2018-01-15 NOTE — ED Notes (Signed)
IV[facility]  PAPERS  RESCINDED  PER  DR Silverio Lay MD  Raymondo Band  RN

## 2018-01-15 NOTE — ED Notes (Signed)
Patient requested grape juice, grape juice was given to patient.

## 2018-01-15 NOTE — ED Notes (Signed)
[facility]/consult ordered waiting on Psychiatrist in AM
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
## 2018-01-15 NOTE — ED Notes (Signed)
Patient in bathroom

## 2018-01-15 NOTE — ED Notes (Signed)
Report received from Mayra, EDT. Q15 minute rounding sheet taken over. Patient is up and requested water and to use the telephone. This EDT explained to patient that she would have to wait until the morning time to use the phone when it is phone hours and patient given water. Will continue to monitor.

## 2018-01-15 NOTE — BH Assessment (Signed)
Pt asleep, TTS unable to assess at this time

## 2018-01-15 NOTE — ED Notes (Signed)
Patient sleeping

## 2018-01-15 NOTE — ED Notes (Signed)
Patient was standing in doorway of room. Patient had come in EMS for a Psych Evaluation. This nurse did not get report from EMS but was told by charge nurse and secretary that patient was intoxicated according to EMS and agitated according to EMS there might have been an overdose involved but patient denies.

## 2018-01-15 NOTE — ED Notes (Signed)
Patient alert, oriented and ambulatory. Patient discharged home. Patient vitals at discharge 98.7-140/97-89-20-100%. All patient belongings returned to her at discharge. Patient AVS/Follow-up reviewed with her and written copy given to patient . Patient verbalized understanding and has no questions at this time. Patient given bus pass to get home.

## 2018-01-15 NOTE — ED Notes (Signed)
Patient awake.  Calm and cooperative.  Interaction with staff is appropriate.

## 2018-05-07 IMAGING — DX DG FINGER LITTLE 2+V*L*
3 series · 3 of 3 positions shown · non-contrast
Comparison: None.

CLINICAL DATA: Patient with injury to the little finger. Swelling
and pain. Limited range of motion.

EXAM:
LEFT LITTLE FINGER 2+V

[finger ap]
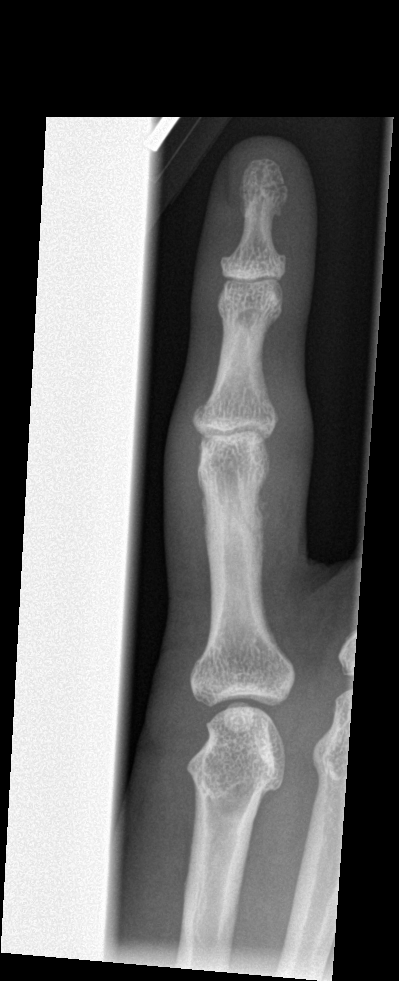

[finger obl]
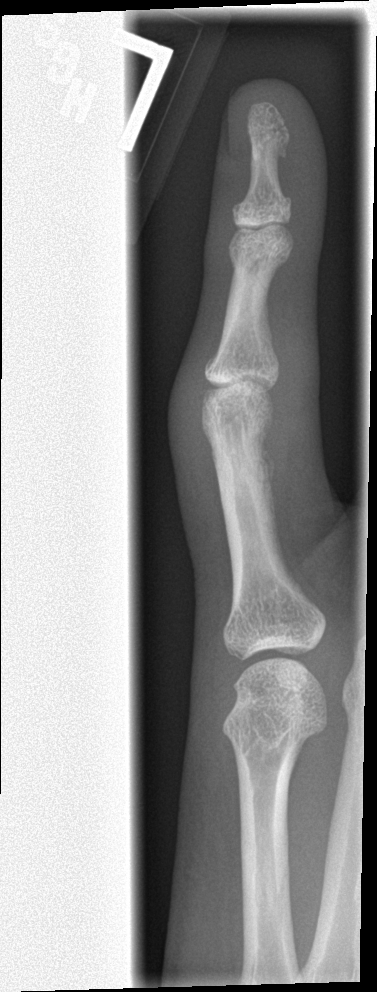

[finger lat]
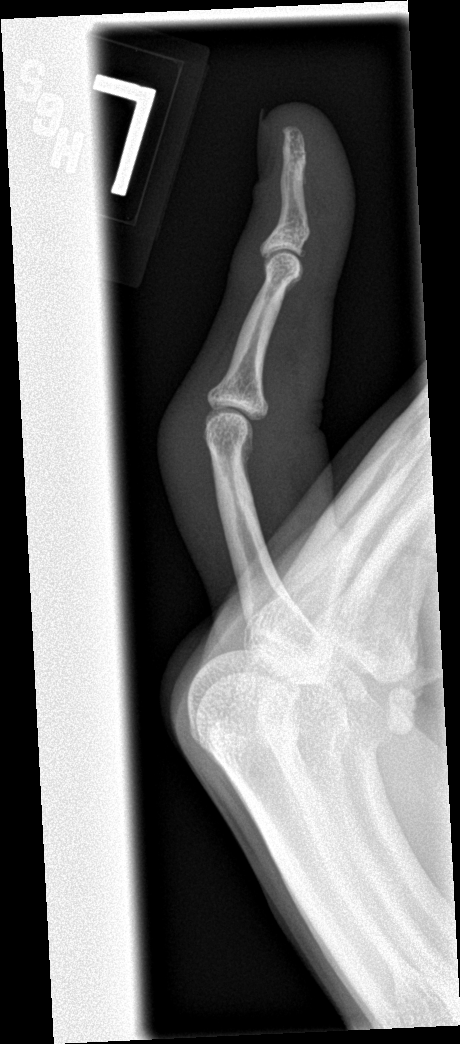

[3 of 3 positions shown; findings below may reference images not displayed]

FINDINGS: Normal anatomic alignment. No evidence for acute fracture or
dislocation. Soft tissue swelling about the PIP joint.
IMPRESSION: Soft tissue swelling about the PIP joint. No acute osseous
abnormality.

## 2019-02-07 ENCOUNTER — Emergency Department
Admission: EM | Admit: 2019-02-07 | Discharge: 2019-02-07 | Disposition: A | Discharge status: Home / Self Care | Payer: Medicaid Other | Attending: Emergency Medicine | Admitting: Emergency Medicine

## 2019-02-07 ENCOUNTER — Encounter: Payer: Self-pay | Admitting: Emergency Medicine

## 2019-02-07 ENCOUNTER — Other Ambulatory Visit: Payer: Self-pay

## 2019-02-07 DIAGNOSIS — Z79899 Other long term (current) drug therapy: Secondary | ICD-10-CM | POA: Insufficient documentation

## 2019-02-07 DIAGNOSIS — K644 Residual hemorrhoidal skin tags: Secondary | ICD-10-CM | POA: Diagnosis not present

## 2019-02-07 DIAGNOSIS — K625 Hemorrhage of anus and rectum: Secondary | ICD-10-CM | POA: Insufficient documentation

## 2019-02-07 DIAGNOSIS — F10929 Alcohol use, unspecified with intoxication, unspecified: Secondary | ICD-10-CM | POA: Diagnosis not present

## 2019-02-07 DIAGNOSIS — Y908 Blood alcohol level of 240 mg/100 ml or more: Secondary | ICD-10-CM | POA: Insufficient documentation

## 2019-02-07 DIAGNOSIS — F17210 Nicotine dependence, cigarettes, uncomplicated: Secondary | ICD-10-CM | POA: Diagnosis not present

## 2019-02-07 LAB — CBC WITH DIFFERENTIAL/PLATELET
Abs Immature Granulocytes: 0.01 10*3/uL (ref 0.00–0.07)
Basophils Absolute: 0 10*3/uL (ref 0.0–0.1)
Basophils Relative: 1 %
Eosinophils Absolute: 0.1 10*3/uL (ref 0.0–0.5)
Eosinophils Relative: 1 %
HCT: 42.1 % (ref 36.0–46.0)
Hemoglobin: 14.4 g/dL (ref 12.0–15.0)
Immature Granulocytes: 0 %
Lymphocytes Relative: 60 %
Lymphs Abs: 3.7 10*3/uL (ref 0.7–4.0)
MCH: 36.3 pg — ABNORMAL HIGH (ref 26.0–34.0)
MCHC: 34.2 g/dL (ref 30.0–36.0)
MCV: 106 fL — ABNORMAL HIGH (ref 80.0–100.0)
Monocytes Absolute: 0.5 10*3/uL (ref 0.1–1.0)
Monocytes Relative: 9 %
Neutro Abs: 1.8 10*3/uL (ref 1.7–7.7)
Neutrophils Relative %: 29 %
Platelets: 270 10*3/uL (ref 150–400)
RBC: 3.97 MIL/uL (ref 3.87–5.11)
RDW: 12.5 % (ref 11.5–15.5)
WBC: 6.1 10*3/uL (ref 4.0–10.5)
nRBC: 0 % (ref 0.0–0.2)

## 2019-02-07 LAB — BASIC METABOLIC PANEL
Anion gap: 11 (ref 5–15)
BUN: 11 mg/dL (ref 6–20)
CO2: 22 mmol/L (ref 22–32)
Calcium: 9.1 mg/dL (ref 8.9–10.3)
Chloride: 105 mmol/L (ref 98–111)
Creatinine, Ser: 0.55 mg/dL (ref 0.44–1.00)
GFR calc Af Amer: >60 mL/min (ref >60)
GFR calc non Af Amer: >60 mL/min (ref >60)
Glucose, Bld: 98 mg/dL (ref 70–99)
Potassium: 4 mmol/L (ref 3.5–5.1)
Sodium: 138 mmol/L (ref 135–145)

## 2019-02-07 LAB — ETHANOL: Alcohol, Ethyl (B): 377 mg/dL (ref <10)

## 2019-02-07 MED ORDER — DROPERIDOL 2.5 MG/ML IJ SOLN
2.5000 mg | Freq: Once | INTRAMUSCULAR | Status: AC
  Administered 2019-02-07: 05:00:00 2.5 mg via INTRAVENOUS
  Filled 2019-02-07: qty 2

## 2019-02-07 MED ORDER — DOCUSATE SODIUM 100 MG PO CAPS: ORAL_CAPSULE | ORAL | 0 refills | Status: DC

## 2019-02-07 MED ORDER — PROCTOFOAM HC 1-1 % EX FOAM: 1.0000 | Freq: Two times a day (BID) | CUTANEOUS | 0 refills | Status: DC

## 2019-02-07 NOTE — ED Notes (Signed)
Pt resting quietly with eyes closed at this time, respirations equal and unlabored. Continue to monitor.

## 2019-02-07 NOTE — ED Provider Notes (Signed)
7:12 AM Assumed care for off going team.   Blood pressure 128/90, pulse (!) 119, temperature 98.3 F (36.8 C), temperature source Oral, resp. rate 18, height 5\' 11"  (1.803 m), weight 68.9 kg, last menstrual period 08/16/2016, SpO2 99 %.  See their HPI for full report but in brief Sober re-eval- intoxicated 377, agitated. Got droperidol.   10:40 AM patient is now awake and pleasant.  Will work on ambulating patient and seeing if we can find her a ride home.  Patient tolerating eating and is clinically sober at this time.  Will help arrange patient ride home.  I discussed the provisional nature of ED diagnosis, the treatment so far, the ongoing plan of care, follow up appointments and return precautions with the patient and any family or support people present. They expressed understanding and agreed with the plan, discharged home.            Vanessa [place], MD 02/07/19 [phone]

## 2019-02-07 NOTE — ED Triage Notes (Signed)
Pt arrives via [facility] with c/o rectal bleeding and ETOH. Pt states that she drank x 3 40's this evening. Per EMS, pt has HR of 128, BP 128/90, and O2 100%.

## 2019-02-07 NOTE — Discharge Instructions (Addendum)
As we discussed, it is very important that you follow up with Dr. Dahlia Byes in the surgery clinic regarding your rectal bleeding.  Although it appears that it is a result of a hemorrhoid, he may be able to help you out with this as well as to make sure nothing else is going on that you need to know about.  Please read through the included information and use the prescribed medications; the foam will help with the pain, and the Colace (stool softener) will make having a bowel movement less painful for you.  Please try to cut back on your alcohol use.  Return to the emergency department with new or worsening symptoms.

## 2019-02-07 NOTE — ED Notes (Signed)
Pt with large rectal hemorrhoids upon visualization with MD Karma Greaser.

## 2019-02-07 NOTE — ED Notes (Signed)
Report received pt brought in with etoh at 3am. Once awake will need to find a ride home. Has been restly quietly for prior shift.

## 2019-02-07 NOTE — ED Notes (Signed)
Pt resting quietly with eyes closed, continue to monitor.

## 2019-02-07 NOTE — ED Notes (Signed)
Pt taken to the lobby and given phone to call taxi

## 2019-02-07 NOTE — ED Notes (Signed)
MD Karma Greaser made aware of critical ETOH.

## 2019-02-07 NOTE — ED Notes (Signed)
Pt states unable to get a ride, that she usually walks everywhere. Advised her if she cant find a ride we need to make sure she is legally sober before we can discharge. Pt denies currently being intoxicated. Given a lunch tray.

## 2019-02-07 NOTE — ED Notes (Signed)
Pt appears angry when this RN came to bedside. This RN asked pt what was wrong and pt replied "[id number]". When this RN asked what did the pt mean, she pointed out the door of the room. Pt now patting this RN's arm and asking for this RN to see her again before 0700.

## 2019-02-07 NOTE — ED Provider Notes (Signed)
[facility] Emergency Department Provider Note  ____________________________________________   First MD Initiated Contact with Patient 02/07/19 [phone]     (approximate)  I have reviewed the triage vital signs and the nursing notes.   HISTORY  Chief Complaint Alcohol Intoxication  Level 5 caveat:  history/ROS limited by acute intoxication  HPI Lindsay Price is a 48 y.o. female who is clearly intoxicated and reports having three 40-ounces tonight as opposed to her usual 2.  She comes in by EMS for evaluation of rectal bleeding as well as her intoxication.  She reports that this happens from time to time but she has never had it checked out.  She said it would get better and go away and then will come back again and she feels like there is a lump "at my butthole" that is painful and it bleeds a lot and then it will get better.  Sometimes it is bigger than other times but she describes overall the symptoms as severe.  She does not know if she has a history of hemorrhoids.  She admits to drinking at least two 40 ounces a day and says that she has had more than usual tonight.  She denies lightheadedness, cough, shortness of breath, nausea, vomiting, and abdominal pain.  She said that she has severe pain in her "bottom".  Nothing particular makes the symptoms better or worse.        Past Medical History:  Diagnosis Date  . Alcoholism [facility])     Patient Active Problem List   Diagnosis Date Noted  . Amitriptyline overdose of undetermined intent 11/13/2017  . Alcohol abuse 11/13/2017  . Substance induced mood disorder (HCC) 11/13/2017    Past Surgical History:  Procedure Laterality Date  . NO PAST SURGERIES      Prior to Admission medications   Medication Sig Start Date End Date Taking? Authorizing Provider  amLODipine (NORVASC) 10 MG tablet Take 1 tablet (10 mg total) by mouth daily. 11/15/17   Alford Highland, MD  chlordiazePOXIDE (LIBRIUM) 25 MG capsule  Take 1 capsule (25 mg total) by mouth every morning. 11/15/17   Alford Highland, MD  docusate sodium (COLACE) 100 MG capsule Take 1 tablet once or twice daily as needed for constipation while taking narcotic pain medicine 02/07/19   Loleta Rose, MD  folic acid (FOLVITE) 1 MG tablet Take 1 tablet (1 mg total) by mouth daily. 11/15/17   Alford Highland, MD  hydrocortisone-pramoxine (PROCTOFOAM [facility]) rectal foam Place 1 applicator rectally 2 (two) times daily. 02/07/19   Loleta Rose, MD  Multiple Vitamin (MULTIVITAMIN WITH MINERALS) TABS tablet Take 1 tablet by mouth daily. 11/15/17   Alford Highland, MD  thiamine 100 MG tablet Take 1 tablet (100 mg total) by mouth daily. 11/15/17   Alford Highland, MD    Allergies Patient has no known allergies.  No family history on file.  Social History Social History   Tobacco Use  . Smoking status: Current Every Day Smoker    Packs/day: 0.50    Types: Cigarettes  . Smokeless tobacco: Never Used  Substance Use Topics  . Alcohol use: Yes    Alcohol/week: 98.0 standard drinks    Types: 28 Cans of beer, 70 Shots of liquor per week  . Drug use: Not Currently    Types: Marijuana    Review of Systems Level 5 caveat:  history/ROS limited by acute intoxication  Constitutional: No fever/chills Eyes: No visual changes. ENT: No sore throat. Cardiovascular: Denies chest  pain. Respiratory: Denies shortness of breath. Gastrointestinal: Rectal bleeding and rectal pain.  No abdominal pain.  No nausea, no vomiting.  No diarrhea.  No constipation. Genitourinary: Negative for dysuria. Musculoskeletal: Negative for neck pain.  Negative for back pain. Integumentary: Negative for rash. Neurological: Negative for headaches, focal weakness or numbness.   ____________________________________________   PHYSICAL EXAM:  VITAL SIGNS: ED Triage Vitals  Enc Vitals Group     BP 02/07/19 0322 128/90     Pulse Rate 02/07/19 0322 (!) 119     Resp 02/07/19 0322 18      Temp 02/07/19 0322 98.3 F (36.8 C)     Temp Source 02/07/19 0322 Oral     SpO2 02/07/19 0322 99 %     Weight 02/07/19 0323 68.9 kg (152 lb)     Height 02/07/19 0323 1.803 m (5\' 11" )     Head Circumference --      Peak Flow --      Pain Score 02/07/19 0323 6     Pain Loc --      Pain Edu? --      Excl. in [place]? --     Constitutional: Alert and oriented but clearly intoxicated, unsteady on feet, slurred speech. Eyes: Conjunctivae are normal.  Head: Atraumatic. Nose: No congestion/rhinnorhea. Mouth/Throat: Mucous membranes are moist. Neck: No stridor.  No meningeal signs.   Cardiovascular: Normal rate, regular rhythm. Good peripheral circulation. Grossly normal heart sounds. Respiratory: Normal respiratory effort.  No retractions. Gastrointestinal: Soft and nontender. No distention.  Rectal: There is what appears to be a large external hemorrhoid that is bleeding slightly and is very tender to palpation.  It is difficult to fully assess but it does not appear consistent with a prolapse and I attempted to reduce it but it was nonreducible and the patient did not tolerate the exam well.  It does not appear thrombosed and does not appear infectious.  It has somewhat of an unusual appearance but I suspect this is because of the amount of irritation.  Hemoccult test was positive but there was no gross bright red blood.  ED chaperone present throughout exam. Musculoskeletal: No lower extremity tenderness nor edema. No gross deformities of extremities. Neurologic: Slurred speech and language. No gross focal neurologic deficits are appreciated.  Skin:  Skin is warm, dry and intact. Psychiatric: Mood and affect are normal. Speech and behavior are normal.  ____________________________________________   LABS (all labs ordered are listed, but only abnormal results are displayed)  Labs Reviewed  CBC WITH DIFFERENTIAL/PLATELET - Abnormal; Notable for the following components:      Result Value    MCV 106.0 (*)    MCH 36.3 (*)    All other components within normal limits  ETHANOL - Abnormal; Notable for the following components:   Alcohol, Ethyl (B) 377 (*)    All other components within normal limits  BASIC METABOLIC PANEL   ____________________________________________  EKG  None - EKG not ordered by ED physician.  I reviewed prior EKGs on record and her QTc interval was within normal limits. ____________________________________________  RADIOLOGY Marylou MccoyI, Takya Vandivier, personally viewed and evaluated these images (plain radiographs) as part of my medical decision making, as well as reviewing the written report by the radiologist.  ED MD interpretation: No indication for emergent imaging  Official radiology report(s): No results found.  ____________________________________________   PROCEDURES   Procedure(s) performed (including Critical Care):  Procedures   ____________________________________________   INITIAL IMPRESSION / MDM / ASSESSMENT  AND PLAN / ED COURSE  As part of my medical decision making, I reviewed the following data within the electronic MEDICAL RECORD NUMBER Nursing notes reviewed and incorporated, Labs reviewed , Old EKG reviewed, Old chart reviewed and Notes from prior ED visits   Differential diagnosis includes, but is not limited to, hemorrhoids, neoplasm, diverticular bleed, AV malformation.  The patient has a gross abnormality on rectal exam which I believe is an external hemorrhoid.  I explained to the patient the nature of the hemorrhoid and how it matches completely with her history of waxing and waning symptoms over an extended period of time and at times she said that it will completely disappear and she cannot feel it anymore.  However, based on the appearance of the hemorrhoid and the persistent symptoms, I strongly encouraged her to follow-up with surgery and I will give her follow-up information to see Dr. Everlene Farrier in clinic.  Lab work is pending  but I do not anticipate she will be symptomatic or have any acute abnormal labs other than her ethanol level.  The patient is somewhat labile emotionally and is not safe to be discharged at this time based on her level of intoxication.  She is difficult to redirect and is occasionally quite upset and threatening to leave.  I encouraged her to stay by offering her some medication to help her calm down and she agreed.  After reviewing an old EKG for her QTc interval, I ordered droperidol 2.5 mg IV which should help as a safe and effective calming agent in the setting of her alcohol intoxication.      Clinical Course as of Feb 06 642  Sat Feb 07, 2019  0425 Normal CBC and basic metabolic panel.  Ethanol is 377 showing severe intoxication.  Of note, hemoglobin in particular was normal at 14.4.   [CF]  0640 Patient has been sleeping comfortably.  She is medically cleared for discharge with outpatient follow-up with surgery when she is awake and alert.  Tranferring ED care to Dr. Fuller Plan at 7:00am.   [CF]    Clinical Course User Index [CF] Loleta Rose, MD     ____________________________________________  FINAL CLINICAL IMPRESSION(S) / ED DIAGNOSES  Final diagnoses:  Rectal bleeding  External hemorrhoid, bleeding  Alcoholic intoxication with complication [facility])     MEDICATIONS GIVEN DURING THIS VISIT:  Medications  droperidol (INAPSINE) 2.5 MG/ML injection 2.5 mg (2.5 mg Intravenous Given 02/07/19 0432)     ED Discharge Orders         Ordered    hydrocortisone-pramoxine (PROCTOFOAM [facility]) rectal foam  2 times daily     02/07/19 0455    docusate sodium (COLACE) 100 MG capsule     02/07/19 0455          *Please note:  Lindsay Price was evaluated in Emergency Department on 02/07/2019 for the symptoms described in the history of present illness. She was evaluated in the context of the global COVID-19 pandemic, which necessitated consideration that the patient might be at risk for  infection with the SARS-CoV-2 virus that causes COVID-19. Institutional protocols and algorithms that pertain to the evaluation of patients at risk for COVID-19 are in a state of rapid change based on information released by regulatory bodies including the CDC and federal and state organizations. These policies and algorithms were followed during the patient's care in the ED.  Some ED evaluations and interventions may be delayed as a result of limited staffing during the pandemic.*  Note:  This document was prepared using Dragon voice recognition software and may include unintentional dictation errors.   Hinda Kehr, MD 02/07/19 [phone]

## 2019-02-27 ENCOUNTER — Encounter: Payer: Self-pay | Admitting: Surgery

## 2019-02-27 ENCOUNTER — Ambulatory Visit: Payer: Self-pay | Admitting: Surgery

## 2019-03-04 ENCOUNTER — Encounter: Payer: Self-pay | Admitting: *Deleted

## 2019-03-31 ENCOUNTER — Ambulatory Visit: Payer: Self-pay | Admitting: Surgery

## 2019-05-17 ENCOUNTER — Other Ambulatory Visit: Payer: Self-pay

## 2019-05-17 ENCOUNTER — Emergency Department
Admission: EM | Admit: 2019-05-17 | Discharge: 2019-05-17 | Disposition: A | Discharge status: Home / Self Care | Payer: Medicaid Other | Attending: Emergency Medicine | Admitting: Emergency Medicine

## 2019-05-17 DIAGNOSIS — Z79899 Other long term (current) drug therapy: Secondary | ICD-10-CM | POA: Diagnosis not present

## 2019-05-17 DIAGNOSIS — F17210 Nicotine dependence, cigarettes, uncomplicated: Secondary | ICD-10-CM | POA: Diagnosis not present

## 2019-05-17 DIAGNOSIS — Y908 Blood alcohol level of 240 mg/100 ml or more: Secondary | ICD-10-CM | POA: Insufficient documentation

## 2019-05-17 DIAGNOSIS — F10920 Alcohol use, unspecified with intoxication, uncomplicated: Secondary | ICD-10-CM

## 2019-05-17 DIAGNOSIS — F10120 Alcohol abuse with intoxication, uncomplicated: Secondary | ICD-10-CM | POA: Insufficient documentation

## 2019-05-17 LAB — URINE DRUG SCREEN, QUALITATIVE (ARMC ONLY)
Amphetamines, Ur Screen: NOT DETECTED
Barbiturates, Ur Screen: NOT DETECTED
Benzodiazepine, Ur Scrn: NOT DETECTED
Cannabinoid 50 Ng, Ur ~~LOC~~: NOT DETECTED
Cocaine Metabolite,Ur ~~LOC~~: NOT DETECTED
MDMA (Ecstasy)Ur Screen: NOT DETECTED
Methadone Scn, Ur: NOT DETECTED
Opiate, Ur Screen: NOT DETECTED
Phencyclidine (PCP) Ur S: NOT DETECTED
Tricyclic, Ur Screen: POSITIVE — AB

## 2019-05-17 LAB — COMPREHENSIVE METABOLIC PANEL
ALT: 15 U/L (ref 0–44)
AST: 27 U/L (ref 15–41)
Albumin: 4.3 g/dL (ref 3.5–5.0)
Alkaline Phosphatase: 62 U/L (ref 38–126)
Anion gap: 9 (ref 5–15)
BUN: 13 mg/dL (ref 6–20)
CO2: 27 mmol/L (ref 22–32)
Calcium: 8.9 mg/dL (ref 8.9–10.3)
Chloride: 107 mmol/L (ref 98–111)
Creatinine, Ser: 0.72 mg/dL (ref 0.44–1.00)
GFR calc Af Amer: >60 mL/min (ref >60)
GFR calc non Af Amer: >60 mL/min (ref >60)
Glucose, Bld: 102 mg/dL — ABNORMAL HIGH (ref 70–99)
Potassium: 4.2 mmol/L (ref 3.5–5.1)
Sodium: 143 mmol/L (ref 135–145)
Total Bilirubin: 0.6 mg/dL (ref 0.3–1.2)
Total Protein: 8.4 g/dL — ABNORMAL HIGH (ref 6.5–8.1)

## 2019-05-17 LAB — CBC WITH DIFFERENTIAL/PLATELET
Abs Immature Granulocytes: 0.01 10*3/uL (ref 0.00–0.07)
Basophils Absolute: 0 10*3/uL (ref 0.0–0.1)
Basophils Relative: 0 %
Eosinophils Absolute: 0 10*3/uL (ref 0.0–0.5)
Eosinophils Relative: 1 %
HCT: 42.8 % (ref 36.0–46.0)
Hemoglobin: 14.3 g/dL (ref 12.0–15.0)
Immature Granulocytes: 0 %
Lymphocytes Relative: 60 %
Lymphs Abs: 3.2 10*3/uL (ref 0.7–4.0)
MCH: 35.1 pg — ABNORMAL HIGH (ref 26.0–34.0)
MCHC: 33.4 g/dL (ref 30.0–36.0)
MCV: 105.2 fL — ABNORMAL HIGH (ref 80.0–100.0)
Monocytes Absolute: 0.3 10*3/uL (ref 0.1–1.0)
Monocytes Relative: 6 %
Neutro Abs: 1.8 10*3/uL (ref 1.7–7.7)
Neutrophils Relative %: 33 %
Platelets: 285 10*3/uL (ref 150–400)
RBC: 4.07 MIL/uL (ref 3.87–5.11)
RDW: 13.5 % (ref 11.5–15.5)
WBC: 5.4 10*3/uL (ref 4.0–10.5)
nRBC: 0 % (ref 0.0–0.2)

## 2019-05-17 LAB — ETHANOL: Alcohol, Ethyl (B): 428 mg/dL (ref <10)

## 2019-05-17 MED ORDER — ADULT MULTIVITAMIN W/MINERALS CH
1.0000 | ORAL_TABLET | Freq: Once | ORAL | Status: DC
  Filled 2019-05-17: qty 1

## 2019-05-17 MED ORDER — THIAMINE HCL 100 MG/ML IJ SOLN
Freq: Once | INTRAVENOUS | Status: AC
  Filled 2019-05-17: qty 1000

## 2019-05-17 MED ORDER — LORAZEPAM 2 MG/ML IJ SOLN
1.0000 mg | Freq: Once | INTRAMUSCULAR | Status: AC
  Administered 2019-05-17: 1 mg via INTRAVENOUS
  Filled 2019-05-17: qty 1

## 2019-05-17 MED ORDER — THIAMINE HCL 100 MG PO TABS
100.0000 mg | ORAL_TABLET | Freq: Every day | ORAL | Status: DC
  Administered 2019-05-17: 11:00:00 100 mg via ORAL
  Filled 2019-05-17: qty 1

## 2019-05-17 MED ORDER — SODIUM CHLORIDE 0.9 % IV BOLUS
1000.0000 mL | Freq: Once | INTRAVENOUS | Status: AC
  Administered 2019-05-17: 1000 mL via INTRAVENOUS

## 2019-05-17 MED ORDER — NICOTINE 21 MG/24HR TD PT24
21.0000 mg | MEDICATED_PATCH | Freq: Once | TRANSDERMAL | Status: DC
Start: 2019-05-17 — End: 2019-05-17
  Administered 2019-05-17: 02:00:00 21 mg via TRANSDERMAL
  Filled 2019-05-17: qty 1

## 2019-05-17 MED ORDER — LORAZEPAM 2 MG PO TABS: 0.0000 mg | ORAL_TABLET | Freq: Four times a day (QID) | ORAL | Status: DC

## 2019-05-17 NOTE — ED Notes (Signed)
Yellow socks, yellow bracelet placed on patient. Fall mats placed on floor. Bed alarm set and volume of alarm adjusted to max. Patient given call bell and instructed to call for assistance and to stay in bed. Patient verbalized understanding.

## 2019-05-17 NOTE — Discharge Instructions (Signed)
1.  Drink alcohol only in moderation. °2.  Return to the ER for worsening symptoms, persistent vomiting, difficulty breathing or other concerns. °

## 2019-05-17 NOTE — ED Notes (Signed)
ED Provider at bedside. 

## 2019-05-17 NOTE — ED Triage Notes (Signed)
Patient coming in under IVC for alcohol intoxication.

## 2019-05-17 NOTE — ED Provider Notes (Signed)
1:16 PM Assumed care for off going team.   Blood pressure (!) 124/92, pulse 94, temperature 98.5 F (36.9 C), resp. rate 16, height 5\' 11"  (1.803 m), weight 68.9 kg, last menstrual period 08/16/2016, SpO2 99 %.  See their HPI for full report but in brief Sober re-eval---Went to police department ETO[facility] and was IVC. No psych component.  Got 1 Ativan earlier. Nurse call police to take to jail.   Patient is now alert and oriented.  Patient is able to ambulate.  Patient tolerating p.o.  Patient does not appear intoxicated.  Patient's been here for 12 hours.  Patient feels comfortable with being discharged home at this time  I discussed the provisional nature of ED diagnosis, the treatment so far, the ongoing plan of care, follow up appointments and return precautions with the patient and any family or support people present. They expressed understanding and agreed with the plan, discharged home.           10/16/2016, MD 05/17/19 1316

## 2019-05-17 NOTE — ED Provider Notes (Signed)
[facility] Emergency Department Provider Note   ____________________________________________   First MD Initiated Contact with Patient 05/17/19 [phone]     (approximate)  I have reviewed the triage vital signs and the nursing notes.   HISTORY  Chief Complaint Alcohol Intoxication  Level V caveat: Limited by intoxication  HPI Lindsay Price is a 49 y.o. female brought to the ED from jail via [employer] under IVC for alcohol intoxication.  Reportedly patient surrendered herself at the police department for outstanding warrants but she was too intoxicated to stay at the jail.  Patient is in a state of undress and crying hysterically.  Denies SI/HI/AH/VH. Denies pain.  Rest of history is limited secondary to level of intoxication.       Past Medical History:  Diagnosis Date  . Alcoholism [facility])     Patient Active Problem List   Diagnosis Date Noted  . Amitriptyline overdose of undetermined intent 11/13/2017  . Alcohol abuse 11/13/2017  . Substance induced mood disorder (HCC) 11/13/2017    Past Surgical History:  Procedure Laterality Date  . NO PAST SURGERIES      Prior to Admission medications   Medication Sig Start Date End Date Taking? Authorizing Provider  amLODipine (NORVASC) 10 MG tablet Take 1 tablet (10 mg total) by mouth daily. 11/15/17   Alford Highland, MD  chlordiazePOXIDE (LIBRIUM) 25 MG capsule Take 1 capsule (25 mg total) by mouth every morning. 11/15/17   Alford Highland, MD  docusate sodium (COLACE) 100 MG capsule Take 1 tablet once or twice daily as needed for constipation while taking narcotic pain medicine 02/07/19   Loleta Rose, MD  folic acid (FOLVITE) 1 MG tablet Take 1 tablet (1 mg total) by mouth daily. 11/15/17   Alford Highland, MD  hydrocortisone-pramoxine (PROCTOFOAM [facility]) rectal foam Place 1 applicator rectally 2 (two) times daily. 02/07/19   Loleta Rose, MD  Multiple Vitamin (MULTIVITAMIN WITH MINERALS) TABS tablet Take 1  tablet by mouth daily. 11/15/17   Alford Highland, MD  thiamine 100 MG tablet Take 1 tablet (100 mg total) by mouth daily. 11/15/17   Alford Highland, MD    Allergies Patient has no known allergies.  No family history on file.  Social History Social History   Tobacco Use  . Smoking status: Current Every Day Smoker    Packs/day: 0.50    Types: Cigarettes  . Smokeless tobacco: Never Used  Substance Use Topics  . Alcohol use: Yes    Alcohol/week: 98.0 standard drinks    Types: 28 Cans of beer, 70 Shots of liquor per week  . Drug use: Not Currently    Types: Marijuana    Review of Systems  Constitutional: No fever/chills Eyes: No visual changes. ENT: No sore throat. Cardiovascular: Denies chest pain. Respiratory: Denies shortness of breath. Gastrointestinal: No abdominal pain.  No nausea, no vomiting.  No diarrhea.  No constipation. Genitourinary: Negative for dysuria. Musculoskeletal: Negative for back pain. Skin: Negative for rash. Neurological: Negative for headaches, focal weakness or numbness. Psychiatric:  Positive for alcohol intoxication.  ____________________________________________   PHYSICAL EXAM:  VITAL SIGNS: ED Triage Vitals  Enc Vitals Group     BP      Pulse      Resp      Temp      Temp src      SpO2      Weight      Height      Head Circumference  Peak Flow      Pain Score      Pain Loc      Pain Edu?      Excl. in [place]?     Constitutional: Alert and oriented.  Disheveled appearing and in mild acute distress. Eyes: Conjunctivae are normal. PERRL. EOMI. Head: Atraumatic. Nose: No congestion/rhinnorhea. Mouth/Throat: Mucous membranes are moist.  Oropharynx non-erythematous. Neck: No stridor.   Cardiovascular: Normal rate, regular rhythm. Grossly normal heart sounds.  Good peripheral circulation. Respiratory: Normal respiratory effort.  No retractions. Lungs CTAB. Gastrointestinal: Soft and nontender. No distention. No abdominal  bruits. No CVA tenderness. Musculoskeletal: No lower extremity tenderness nor edema.  No joint effusions. Neurologic:  Normal speech and language. No gross focal neurologic deficits are appreciated.  Skin:  Skin is warm, dry and intact. No rash noted. Psychiatric: Mood and affect are tearful. Speech and behavior are normal.  ____________________________________________   LABS (all labs ordered are listed, but only abnormal results are displayed)  Labs Reviewed  CBC WITH DIFFERENTIAL/PLATELET - Abnormal; Notable for the following components:      Result Value   MCV 105.2 (*)    MCH 35.1 (*)    All other components within normal limits  COMPREHENSIVE METABOLIC PANEL - Abnormal; Notable for the following components:   Glucose, Bld 102 (*)    Total Protein 8.4 (*)    All other components within normal limits  ETHANOL - Abnormal; Notable for the following components:   Alcohol, Ethyl (B) 428 (*)    All other components within normal limits  URINE DRUG SCREEN, QUALITATIVE ([facility] ONLY) - Abnormal; Notable for the following components:   Tricyclic, Ur Screen POSITIVE (*)    All other components within normal limits   ____________________________________________  EKG  None ____________________________________________  RADIOLOGY  ED MD interpretation: None  Official radiology report(s): No results found.  ____________________________________________   PROCEDURES  Procedure(s) performed (including Critical Care):  Procedures   ____________________________________________   INITIAL IMPRESSION / ASSESSMENT AND PLAN / ED COURSE  As part of my medical decision making, I reviewed the following data within the [place] notes reviewed and incorporated, Labs reviewed, Old chart reviewed and Notes from prior ED visits     Lindsay Price was evaluated in Emergency Department on 05/17/2019 for the symptoms described in the history of present illness.  She was evaluated in the context of the global COVID-19 pandemic, which necessitated consideration that the patient might be at risk for infection with the SARS-CoV-2 virus that causes COVID-19. Institutional protocols and algorithms that pertain to the evaluation of patients at risk for COVID-19 are in a state of rapid change based on information released by regulatory bodies including the CDC and federal and state organizations. These policies and algorithms were followed during the patient's care in the ED.    49 year old female who presents under IVC for alcohol intoxication.  She is currently quite tearful.  Will maintain IVC pending IV fluids, sobriety and psychiatric reassessment.  Clinical Course as of May 16 700  Nancy Fetter May 17, 2019  0241 Patient sleeping in no acute distress.  Elevated EtOH.  IV fluids infusing.   [JS]  [id number] Patient resting in no acute distress.  Banana bag ordered.   [JS]  0701 Care transferred to Dr. Jari Pigg at change of shift. May rescind IVC after patient is awake, sober and ambulatory with steady gait.   [JS]    Clinical Course User Index [JS] Paulette Blanch,  MD     ____________________________________________   FINAL CLINICAL IMPRESSION(S) / ED DIAGNOSES  Final diagnoses:  Alcoholic intoxication without complication [facility])     ED Discharge Orders    None       Note:  This document was prepared using Dragon voice recognition software and may include unintentional dictation errors.   Irean Hong, MD 05/17/19 [phone]

## 2019-05-17 NOTE — ED Notes (Signed)
Pt verbalized understanding of discharge instructions and need for follow up care. NAD at this time.

## 2019-07-01 ENCOUNTER — Encounter: Payer: Self-pay | Admitting: *Deleted

## 2019-08-13 ENCOUNTER — Emergency Department: Payer: Medicaid Other

## 2019-08-13 ENCOUNTER — Encounter: Payer: Self-pay | Admitting: Emergency Medicine

## 2019-08-13 ENCOUNTER — Other Ambulatory Visit: Payer: Self-pay

## 2019-08-13 ENCOUNTER — Emergency Department
Admission: EM | Admit: 2019-08-13 | Discharge: 2019-08-13 | Disposition: A | Discharge status: Home / Self Care | Payer: Medicaid Other | Attending: Emergency Medicine | Admitting: Emergency Medicine

## 2019-08-13 DIAGNOSIS — F17210 Nicotine dependence, cigarettes, uncomplicated: Secondary | ICD-10-CM | POA: Insufficient documentation

## 2019-08-13 DIAGNOSIS — Z79899 Other long term (current) drug therapy: Secondary | ICD-10-CM | POA: Insufficient documentation

## 2019-08-13 DIAGNOSIS — M654 Radial styloid tenosynovitis [de Quervain]: Secondary | ICD-10-CM | POA: Insufficient documentation

## 2019-08-13 DIAGNOSIS — X500XXA Overexertion from strenuous movement or load, initial encounter: Secondary | ICD-10-CM | POA: Diagnosis not present

## 2019-08-13 DIAGNOSIS — Y9389 Activity, other specified: Secondary | ICD-10-CM | POA: Diagnosis not present

## 2019-08-13 DIAGNOSIS — Y999 Unspecified external cause status: Secondary | ICD-10-CM | POA: Diagnosis not present

## 2019-08-13 DIAGNOSIS — M25532 Pain in left wrist: Secondary | ICD-10-CM | POA: Diagnosis present

## 2019-08-13 DIAGNOSIS — Y929 Unspecified place or not applicable: Secondary | ICD-10-CM | POA: Insufficient documentation

## 2019-08-13 MED ORDER — DICLOFENAC SODIUM 50 MG PO TBEC: 50.0000 mg | DELAYED_RELEASE_TABLET | Freq: Two times a day (BID) | ORAL | 0 refills | Status: AC
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
## 2019-08-13 NOTE — ED Provider Notes (Signed)
[facility] Emergency Department Provider Note ____________________________________________  Time seen: 1431  I have reviewed the triage vital signs and the nursing notes.  HISTORY  Chief Complaint  Wrist Pain and Hand Pain  HPI Lindsay Price is a 49 y.o.right handed female presents her self to the ED for evaluation of left wrist pain at the thumb.   Patient admits to working over the last several days moving and stacking large wooden boards.  She describes pain is worsened over the last 2 days.  She denies any direct trauma or falls.  She was brought in via EMS from a street in [place].  Patient was apparently pacing back-and-forth complaint of right pain to the wrist.  Patient reports swelling and tightness to the wrist and hand.  She denies any other injury at this time.  Patient admits to 6 alcoholic seltzer drinks today.  No chest pain, shortness of breath, falls reported.  Past Medical History:  Diagnosis Date  . Alcoholism [facility])     Patient Active Problem List   Diagnosis Date Noted  . Amitriptyline overdose of undetermined intent 11/13/2017  . Alcohol abuse 11/13/2017  . Substance induced mood disorder ([place]) 11/13/2017    Past Surgical History:  Procedure Laterality Date  . NO PAST SURGERIES      Prior to Admission medications   Medication Sig Start Date End Date Taking? Authorizing Provider  amLODipine (NORVASC) 10 MG tablet Take 1 tablet (10 mg total) by mouth daily. 11/15/17   Loletha Grayer, MD  chlordiazePOXIDE (LIBRIUM) 25 MG capsule Take 1 capsule (25 mg total) by mouth every morning. 11/15/17   Loletha Grayer, MD  diclofenac (VOLTAREN) 50 MG EC tablet Take 1 tablet (50 mg total) by mouth 2 (two) times daily for 15 days. 08/13/19 08/28/19  Marcell Pfeifer, Dannielle Karvonen, PA-C  docusate sodium (COLACE) 100 MG capsule Take 1 tablet once or twice daily as needed for constipation while taking narcotic pain medicine 02/07/19   Hinda Kehr, MD  folic  acid (FOLVITE) 1 MG tablet Take 1 tablet (1 mg total) by mouth daily. 11/15/17   Loletha Grayer, MD  hydrocortisone-pramoxine (PROCTOFOAM [facility]) rectal foam Place 1 applicator rectally 2 (two) times daily. 02/07/19   Hinda Kehr, MD  Multiple Vitamin (MULTIVITAMIN WITH MINERALS) TABS tablet Take 1 tablet by mouth daily. 11/15/17   Loletha Grayer, MD  thiamine 100 MG tablet Take 1 tablet (100 mg total) by mouth daily. 11/15/17   Loletha Grayer, MD    Allergies Patient has no known allergies.  History reviewed. No pertinent family history.  Social History Social History   Tobacco Use  . Smoking status: Current Every Day Smoker    Packs/day: 0.50    Types: Cigarettes  . Smokeless tobacco: Never Used  Substance Use Topics  . Alcohol use: Yes    Alcohol/week: 98.0 standard drinks    Types: 28 Cans of beer, 70 Shots of liquor per week  . Drug use: Not Currently    Types: Marijuana    Review of Systems  Constitutional: Negative for fever. Cardiovascular: Negative for chest pain. Respiratory: Negative for shortness of breath. Musculoskeletal: Negative for back pain.  Right wrist and thumb pain as above. Skin: Negative for rash. Neurological: Negative for headaches, focal weakness or numbness. ____________________________________________  PHYSICAL EXAM:  VITAL SIGNS: ED Triage Vitals  Enc Vitals Group     BP 08/13/19 1357 (!) 121/97     Pulse Rate 08/13/19 1357 (!) 110  Resp 08/13/19 1357 20     Temp 08/13/19 1400 99 F (37.2 C)     Temp Source 08/13/19 1400 Oral     SpO2 08/13/19 1357 99 %     Weight 08/13/19 1358 182 lb (82.6 kg)     Height 08/13/19 1358 5\' 11"  (1.803 m)     Head Circumference --      Peak Flow --      Pain Score 08/13/19 1357 10     Pain Loc --      Pain Edu? --      Excl. in GC? --     Constitutional: Alert and oriented. Well appearing and in no distress. Head: Normocephalic and atraumatic. Eyes: Conjunctivae are normal. Normal extraocular  movements Cardiovascular: Normal rate, regular rhythm. Normal distal pulses. Respiratory: Normal respiratory effort.  Musculoskeletal: right wrist without any obvious deformity or dislocation.  Normal composite fist on the right.  She is tender to palpation over the base of the right thumb as well as along the course of the extensor pollicis longus.  Positive Finkelstein on exam.  Nontender with normal range of motion in all other extremities.  Neurologic:  Normal gait without ataxia. Normal speech and language. No gross focal neurologic deficits are appreciated. Skin:  Skin is warm, dry and intact. No rash noted. ____________________________________________   RADIOLOGY  DG Right Wrist  IMPRESSION: Mild diffuse degenerative change.  No acute abnormality. ____________________________________________  PROCEDURES  Abducted thumb wrist cock-up splint  Procedures ____________________________________________  INITIAL IMPRESSION / ASSESSMENT AND PLAN / ED COURSE  Right-handed female with ED evaluation of persistent worsening pain to the right radial aspect of the wrist of the thumb.  Patient clinical picture is concerning for possible tenosynovitis.  She does not reveal any acute fracture or dislocation.  Patient will be placed in an abductor thumb spica splint for support.  She is also discharged with a prescription for diclofenac to take as directed.  She is referred to orthopedics for ongoing symptom management.  Return precautions have been reviewed.  10/13/19 was evaluated in Emergency Department on 08/13/2019 for the symptoms described in the history of present illness. She was evaluated in the context of the global COVID-19 pandemic, which necessitated consideration that the patient might be at risk for infection with the SARS-CoV-2 virus that causes COVID-19. Institutional protocols and algorithms that pertain to the evaluation of patients at risk for COVID-19 are in a state of rapid  change based on information released by regulatory bodies including the CDC and federal and state organizations. These policies and algorithms were followed during the patient's care in the ED. ____________________________________________  FINAL CLINICAL IMPRESSION(S) / ED DIAGNOSES  Final diagnoses:  10/13/2019 tenosynovitis      Suzette Battiest Karmen Stabs, PA-C 08/13/19 1611    10/13/19, MD 08/16/19 1205

## 2019-08-13 NOTE — Discharge Instructions (Signed)
Your exam and XR are consistent with a thumb tendinitis. There is no X-Ray evidence of fracture or dislocation to the thumb. You are being placed in a thumb splint to rest the thumb. Wear it for all activities. You should also apply ice to reduce pain and swelling. Take the pain & inflammation medicine as directed. Return to the ED as needed.

## 2019-08-13 NOTE — ED Notes (Signed)
See triage note  Presents with pain to right hand and wrist area  States developed pain couple of days ago  Now states pain is moving into forearm  Denies any recent injury

## 2019-08-13 NOTE — ED Triage Notes (Signed)
Pt reports pain to her right hand and wrist for the last couple of days getting worse daily.

## 2019-09-23 ENCOUNTER — Other Ambulatory Visit: Payer: Self-pay

## 2019-09-23 ENCOUNTER — Emergency Department: Payer: Medicaid Other

## 2019-09-23 ENCOUNTER — Emergency Department
Admission: EM | Admit: 2019-09-23 | Discharge: 2019-09-24 | Disposition: A | Discharge status: Home / Self Care | Payer: Medicaid Other | Attending: Emergency Medicine | Admitting: Emergency Medicine

## 2019-09-23 ENCOUNTER — Ambulatory Visit
Admission: EM | Admit: 2019-09-23 | Discharge: 2019-09-23 | Disposition: A | Discharge status: Home / Self Care | Payer: No Typology Code available for payment source | Attending: Emergency Medicine | Admitting: Emergency Medicine

## 2019-09-23 DIAGNOSIS — Y908 Blood alcohol level of 240 mg/100 ml or more: Secondary | ICD-10-CM | POA: Insufficient documentation

## 2019-09-23 DIAGNOSIS — T7621XA Adult sexual abuse, suspected, initial encounter: Secondary | ICD-10-CM | POA: Diagnosis not present

## 2019-09-23 DIAGNOSIS — Z0441 Encounter for examination and observation following alleged adult rape: Secondary | ICD-10-CM | POA: Insufficient documentation

## 2019-09-23 DIAGNOSIS — F10929 Alcohol use, unspecified with intoxication, unspecified: Secondary | ICD-10-CM

## 2019-09-23 DIAGNOSIS — Z113 Encounter for screening for infections with a predominantly sexual mode of transmission: Secondary | ICD-10-CM | POA: Insufficient documentation

## 2019-09-23 DIAGNOSIS — F10129 Alcohol abuse with intoxication, unspecified: Secondary | ICD-10-CM | POA: Insufficient documentation

## 2019-09-23 DIAGNOSIS — R55 Syncope and collapse: Secondary | ICD-10-CM | POA: Diagnosis present

## 2019-09-23 LAB — CBC WITH DIFFERENTIAL/PLATELET
Abs Immature Granulocytes: 0.02 10*3/uL (ref 0.00–0.07)
Basophils Absolute: 0 10*3/uL (ref 0.0–0.1)
Basophils Relative: 0 %
Eosinophils Absolute: 0.1 10*3/uL (ref 0.0–0.5)
Eosinophils Relative: 1 %
HCT: 42.3 % (ref 36.0–46.0)
Hemoglobin: 14.7 g/dL (ref 12.0–15.0)
Immature Granulocytes: 0 %
Lymphocytes Relative: 52 %
Lymphs Abs: 4 10*3/uL (ref 0.7–4.0)
MCH: 35.2 pg — ABNORMAL HIGH (ref 26.0–34.0)
MCHC: 34.8 g/dL (ref 30.0–36.0)
MCV: 101.2 fL — ABNORMAL HIGH (ref 80.0–100.0)
Monocytes Absolute: 0.5 10*3/uL (ref 0.1–1.0)
Monocytes Relative: 7 %
Neutro Abs: 3.1 10*3/uL (ref 1.7–7.7)
Neutrophils Relative %: 40 %
Platelets: 288 10*3/uL (ref 150–400)
RBC: 4.18 MIL/uL (ref 3.87–5.11)
RDW: 13.9 % (ref 11.5–15.5)
WBC: 7.8 10*3/uL (ref 4.0–10.5)
nRBC: 0 % (ref 0.0–0.2)

## 2019-09-23 LAB — BASIC METABOLIC PANEL
Anion gap: 11 (ref 5–15)
BUN: 10 mg/dL (ref 6–20)
CO2: 27 mmol/L (ref 22–32)
Calcium: 8.9 mg/dL (ref 8.9–10.3)
Chloride: 105 mmol/L (ref 98–111)
Creatinine, Ser: 0.64 mg/dL (ref 0.44–1.00)
GFR calc Af Amer: >60 mL/min (ref >60)
GFR calc non Af Amer: >60 mL/min (ref >60)
Glucose, Bld: 101 mg/dL — ABNORMAL HIGH (ref 70–99)
Potassium: 3.8 mmol/L (ref 3.5–5.1)
Sodium: 143 mmol/L (ref 135–145)

## 2019-09-23 LAB — ETHANOL: Alcohol, Ethyl (B): 318 mg/dL (ref <10)

## 2019-09-23 LAB — TROPONIN I (HIGH SENSITIVITY): Troponin I (High Sensitivity): 6 ng/L (ref <18)

## 2019-09-23 MED ORDER — CEFTRIAXONE SODIUM 250 MG IJ SOLR
250.0000 mg | Freq: Once | INTRAMUSCULAR | Status: AC
  Administered 2019-09-24: 250 mg via INTRAMUSCULAR
  Filled 2019-09-23: qty 250

## 2019-09-23 MED ORDER — METRONIDAZOLE 500 MG PO TABS
2000.0000 mg | ORAL_TABLET | Freq: Once | ORAL | Status: AC
  Administered 2019-09-24: 2000 mg via ORAL
  Filled 2019-09-23: qty 4

## 2019-09-23 MED ORDER — LIDOCAINE HCL (PF) 1 % IJ SOLN
0.9000 mL | Freq: Once | INTRAMUSCULAR | Status: AC
  Administered 2019-09-24: 0.9 mL
  Filled 2019-09-23: qty 5

## 2019-09-23 MED ORDER — AZITHROMYCIN 500 MG PO TABS
1000.0000 mg | ORAL_TABLET | Freq: Once | ORAL | Status: AC
  Administered 2019-09-24: 1000 mg via ORAL
  Filled 2019-09-23: qty 2
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
## 2020-05-04 ENCOUNTER — Other Ambulatory Visit: Payer: Self-pay

## 2020-05-04 ENCOUNTER — Emergency Department: Payer: Medicaid Other

## 2020-05-04 ENCOUNTER — Observation Stay
Admission: EM | Admit: 2020-05-04 | Discharge: 2020-05-05 | Disposition: A | Discharge status: Home / Self Care | Payer: Medicaid Other | Attending: Internal Medicine | Admitting: Internal Medicine

## 2020-05-04 DIAGNOSIS — I10 Essential (primary) hypertension: Secondary | ICD-10-CM | POA: Diagnosis not present

## 2020-05-04 DIAGNOSIS — Y92480 Sidewalk as the place of occurrence of the external cause: Secondary | ICD-10-CM | POA: Diagnosis not present

## 2020-05-04 DIAGNOSIS — Z20822 Contact with and (suspected) exposure to covid-19: Secondary | ICD-10-CM | POA: Diagnosis not present

## 2020-05-04 DIAGNOSIS — Z79899 Other long term (current) drug therapy: Secondary | ICD-10-CM | POA: Insufficient documentation

## 2020-05-04 DIAGNOSIS — Y9301 Activity, walking, marching and hiking: Secondary | ICD-10-CM | POA: Diagnosis not present

## 2020-05-04 DIAGNOSIS — F17210 Nicotine dependence, cigarettes, uncomplicated: Secondary | ICD-10-CM | POA: Diagnosis not present

## 2020-05-04 DIAGNOSIS — W010XXA Fall on same level from slipping, tripping and stumbling without subsequent striking against object, initial encounter: Secondary | ICD-10-CM | POA: Diagnosis not present

## 2020-05-04 DIAGNOSIS — S2242XA Multiple fractures of ribs, left side, initial encounter for closed fracture: Principal | ICD-10-CM | POA: Insufficient documentation

## 2020-05-04 DIAGNOSIS — S0081XA Abrasion of other part of head, initial encounter: Secondary | ICD-10-CM | POA: Diagnosis not present

## 2020-05-04 DIAGNOSIS — W19XXXA Unspecified fall, initial encounter: Secondary | ICD-10-CM

## 2020-05-04 DIAGNOSIS — S2239XA Fracture of one rib, unspecified side, initial encounter for closed fracture: Secondary | ICD-10-CM | POA: Diagnosis present

## 2020-05-04 DIAGNOSIS — S299XXA Unspecified injury of thorax, initial encounter: Secondary | ICD-10-CM | POA: Diagnosis present

## 2020-05-04 DIAGNOSIS — T148XXA Other injury of unspecified body region, initial encounter: Secondary | ICD-10-CM

## 2020-05-04 LAB — COMPREHENSIVE METABOLIC PANEL
ALT: 17 U/L (ref 0–44)
AST: 38 U/L (ref 15–41)
Albumin: 4.1 g/dL (ref 3.5–5.0)
Alkaline Phosphatase: 72 U/L (ref 38–126)
Anion gap: 11 (ref 5–15)
BUN: 10 mg/dL (ref 6–20)
CO2: 22 mmol/L (ref 22–32)
Calcium: 9 mg/dL (ref 8.9–10.3)
Chloride: 103 mmol/L (ref 98–111)
Creatinine, Ser: 0.69 mg/dL (ref 0.44–1.00)
GFR, Estimated: >60 mL/min (ref >60)
Glucose, Bld: 113 mg/dL — ABNORMAL HIGH (ref 70–99)
Potassium: 4.4 mmol/L (ref 3.5–5.1)
Sodium: 136 mmol/L (ref 135–145)
Total Bilirubin: 0.5 mg/dL (ref 0.3–1.2)
Total Protein: 8.2 g/dL — ABNORMAL HIGH (ref 6.5–8.1)

## 2020-05-04 LAB — CBC
HCT: 42.9 % (ref 36.0–46.0)
HCT: 40.2 % (ref 36.0–46.0)
Hemoglobin: 14.6 g/dL (ref 12.0–15.0)
Hemoglobin: 13.5 g/dL (ref 12.0–15.0)
MCH: 36 pg — ABNORMAL HIGH (ref 26.0–34.0)
MCH: 36 pg — ABNORMAL HIGH (ref 26.0–34.0)
MCHC: 34 g/dL (ref 30.0–36.0)
MCHC: 33.6 g/dL (ref 30.0–36.0)
MCV: 105.9 fL — ABNORMAL HIGH (ref 80.0–100.0)
MCV: 107.2 fL — ABNORMAL HIGH (ref 80.0–100.0)
Platelets: 266 10*3/uL (ref 150–400)
Platelets: 237 10*3/uL (ref 150–400)
RBC: 4.05 MIL/uL (ref 3.87–5.11)
RBC: 3.75 MIL/uL — ABNORMAL LOW (ref 3.87–5.11)
RDW: 13.3 % (ref 11.5–15.5)
RDW: 13.3 % (ref 11.5–15.5)
WBC: 8.8 10*3/uL (ref 4.0–10.5)
WBC: 8 10*3/uL (ref 4.0–10.5)
nRBC: 0 % (ref 0.0–0.2)
nRBC: 0 % (ref 0.0–0.2)

## 2020-05-04 LAB — POC SARS CORONAVIRUS 2 AG -  ED: SARS Coronavirus 2 Ag: NEGATIVE

## 2020-05-04 LAB — CREATININE, SERUM
Creatinine, Ser: 0.85 mg/dL (ref 0.44–1.00)
GFR, Estimated: >60 mL/min (ref >60)

## 2020-05-04 LAB — FOLATE: Folate: 9.8 ng/mL (ref >5.9)

## 2020-05-04 LAB — ETHANOL: Alcohol, Ethyl (B): 14 mg/dL — ABNORMAL HIGH (ref <10)

## 2020-05-04 LAB — TSH: TSH: 1.542 u[IU]/mL (ref 0.350–4.500)

## 2020-05-04 MED ORDER — LORAZEPAM 2 MG PO TABS: 0.0000 mg | ORAL_TABLET | Freq: Two times a day (BID) | ORAL | Status: DC

## 2020-05-04 MED ORDER — BACITRACIN ZINC 500 UNIT/GM EX OINT
TOPICAL_OINTMENT | CUTANEOUS | 0 refills | Status: DC
Start: 2020-05-04 — End: 2020-09-19

## 2020-05-04 MED ORDER — LORAZEPAM 2 MG PO TABS
0.0000 mg | ORAL_TABLET | Freq: Four times a day (QID) | ORAL | Status: DC
  Administered 2020-05-05: 1 mg via ORAL
  Filled 2020-05-04: qty 1

## 2020-05-04 MED ORDER — LORAZEPAM 2 MG/ML IJ SOLN
0.0000 mg | Freq: Four times a day (QID) | INTRAMUSCULAR | Status: DC
  Administered 2020-05-04: 2 mg via INTRAVENOUS
  Filled 2020-05-04: qty 1

## 2020-05-04 MED ORDER — HEPARIN SODIUM (PORCINE) 5000 UNIT/ML IJ SOLN
5000.0000 [IU] | Freq: Three times a day (TID) | INTRAMUSCULAR | Status: DC
  Administered 2020-05-04 – 2020-05-05 (×2): 5000 [IU] via SUBCUTANEOUS
  Filled 2020-05-04 (×2): qty 1

## 2020-05-04 MED ORDER — MORPHINE SULFATE (PF) 4 MG/ML IV SOLN
4.0000 mg | Freq: Once | INTRAVENOUS | Status: AC
  Administered 2020-05-04: 4 mg via INTRAMUSCULAR
  Filled 2020-05-04: qty 1

## 2020-05-04 MED ORDER — METHOCARBAMOL 1000 MG/10ML IJ SOLN
500.0000 mg | Freq: Three times a day (TID) | INTRAVENOUS | Status: DC | PRN
  Filled 2020-05-04: qty 5

## 2020-05-04 MED ORDER — AMLODIPINE BESYLATE 10 MG PO TABS
10.0000 mg | ORAL_TABLET | Freq: Every day | ORAL | Status: DC
  Administered 2020-05-04 – 2020-05-05 (×2): 10 mg via ORAL
  Filled 2020-05-04: qty 2
  Filled 2020-05-04: qty 1

## 2020-05-04 MED ORDER — THIAMINE HCL 100 MG/ML IJ SOLN: 100.0000 mg | Freq: Every day | INTRAMUSCULAR | Status: DC

## 2020-05-04 MED ORDER — MORPHINE SULFATE (PF) 4 MG/ML IV SOLN
4.0000 mg | Freq: Once | INTRAVENOUS | Status: AC
  Administered 2020-05-04: 4 mg via INTRAVENOUS
  Filled 2020-05-04: qty 1

## 2020-05-04 MED ORDER — SODIUM CHLORIDE 0.9% FLUSH: 3.0000 mL | Freq: Two times a day (BID) | INTRAVENOUS | Status: DC

## 2020-05-04 MED ORDER — OXYCODONE HCL 5 MG PO TABS
5.0000 mg | ORAL_TABLET | ORAL | Status: DC | PRN
  Administered 2020-05-05 (×3): 5 mg via ORAL
  Filled 2020-05-04 (×3): qty 1

## 2020-05-04 MED ORDER — ONDANSETRON HCL 4 MG/2ML IJ SOLN: 4.0000 mg | Freq: Four times a day (QID) | INTRAMUSCULAR | Status: DC | PRN

## 2020-05-04 MED ORDER — THIAMINE HCL 100 MG/ML IJ SOLN
Freq: Once | INTRAVENOUS | Status: AC
  Filled 2020-05-04: qty 1000

## 2020-05-04 MED ORDER — OXYCODONE-ACETAMINOPHEN 5-325 MG PO TABS
2.0000 | ORAL_TABLET | Freq: Once | ORAL | Status: AC
  Administered 2020-05-04: 2 via ORAL
  Filled 2020-05-04: qty 2

## 2020-05-04 MED ORDER — KETOROLAC TROMETHAMINE 30 MG/ML IJ SOLN
30.0000 mg | Freq: Once | INTRAMUSCULAR | Status: AC
  Administered 2020-05-04: 30 mg via INTRAVENOUS
  Filled 2020-05-04: qty 1

## 2020-05-04 MED ORDER — IBUPROFEN 400 MG PO TABS: 400.0000 mg | ORAL_TABLET | Freq: Four times a day (QID) | ORAL | Status: DC | PRN

## 2020-05-04 MED ORDER — LORAZEPAM 2 MG/ML IJ SOLN: 0.0000 mg | Freq: Two times a day (BID) | INTRAMUSCULAR | Status: DC

## 2020-05-04 MED ORDER — CYCLOBENZAPRINE HCL 10 MG PO TABS
5.0000 mg | ORAL_TABLET | Freq: Once | ORAL | Status: AC
  Administered 2020-05-04: 5 mg via ORAL
  Filled 2020-05-04: qty 1

## 2020-05-04 MED ORDER — ONDANSETRON HCL 4 MG PO TABS: 4.0000 mg | ORAL_TABLET | Freq: Four times a day (QID) | ORAL | Status: DC | PRN

## 2020-05-04 MED ORDER — OXYCODONE-ACETAMINOPHEN 5-325 MG PO TABS: 1.0000 | ORAL_TABLET | ORAL | 0 refills | Status: DC | PRN

## 2020-05-04 MED ORDER — ALBUTEROL SULFATE (2.5 MG/3ML) 0.083% IN NEBU: 2.5000 mg | INHALATION_SOLUTION | RESPIRATORY_TRACT | Status: DC | PRN

## 2020-05-04 MED ORDER — THIAMINE HCL 100 MG PO TABS
100.0000 mg | ORAL_TABLET | Freq: Every day | ORAL | Status: DC
  Administered 2020-05-04 – 2020-05-05 (×2): 100 mg via ORAL
  Filled 2020-05-04: qty 1

## 2020-05-04 NOTE — ED Notes (Signed)
bree pts daughter [phone]

## 2020-05-04 NOTE — ED Notes (Signed)
Pt resting comfortably at this time. NAD noted. Pt given grape juice per request. Message given to pt from bf josh. Pt denies any needs at this time. Call bell in reach.

## 2020-05-04 NOTE — H&P (Addendum)
History and Physical    Lindsay Price GYK:[id number] DOB: 1970-07-22 DOA: 05/04/2020  PCP: Patient, No Pcp Per  Patient coming from: home I have personally briefly reviewed patient's old medical records in [facility]  Chief Complaint: fall while intoxicated  HPI: Lindsay Price is a 49 y.o. female with medical history significant of  Severe etoh abuse , who presents to ed s/p fall while intoxicated. Per patient  she was walking down a hill when she slipped and fell landing on the sidewalk below. She notes she did hit her head but had no LOC, but states mainly she had pain on her left chest pain. She was brought in by EMS and per noted she was noted to be initially hypoxic to 80's and was placed on [place]  With improvement to 90's she was able to be weaned in ed and is currently without need for O2 sating in mid 90's.  ON further evaluation patient has trauma series that was negative except for CT of chest note fractures ribs on left.  Patient was treated with pain medications x 2 courses without improvement in pain. Due to this patient is being admitted for pain control.  Patient currently states she feels improved her pain is currently controlled, she does note some mild dizziness, but denies n/v/d/abdominal pain. Notes she was in her normal state of health prior to this no fever no chills. She currently is not experiencing any etoh w/d symptoms.  ED Course:  Vs:98.1, hr 104, bp  103/92 At 98 on 2L then weaned to RA 95% , BLT:JQZE left base atelectasis. Lungs elsewhere clear. Borderline cardiac prominence. Aorta mildly prominent, a finding likely indicative of chronic hypertension CT head: NAD CT facial bones: 1. Mild age indeterminate bilateral nasal bone fractures. 2. No other acute traumatic injury identified in the face. CT chest w/o contrast: . Acute nondisplaced fractures of the left posterolateral fifth, sixth and seventh ribs. No pneumothorax or hemothorax. 2. Multiple old  healed anterior rib fractures on the left affecting anterior ribs 3, 4 and 5. 3. Cardiomegaly. Coronary artery calcification. 4. Minimal emphysematous changes of the upper lobes. Mild dependent atelectasis at the lung bases, left more than right.  Emphysema (ICD10-J43.9).  Review of Systems: As per HPI otherwise 10 point review of systems negative.   Past Medical History:  Diagnosis Date  . Alcoholism [facility])     Past Surgical History:  Procedure Laterality Date  . NO PAST SURGERIES       reports that she has been smoking cigarettes. She has been smoking about 0.50 packs per day. She has never used smokeless tobacco. She reports current alcohol use of about 98.0 standard drinks of alcohol per week. She reports previous drug use. Drug: Marijuana.  No Known Allergies  History reviewed. No pertinent family history.  Prior to Admission medications   Medication Sig Start Date End Date Taking? Authorizing Provider  bacitracin ointment Apply to affected area twice daily 05/04/20 05/04/21 Yes Minna Antis, MD  oxyCODONE-acetaminophen (PERCOCET) 5-325 MG tablet Take 1 tablet by mouth every 4 (four) hours as needed for severe pain. 05/04/20  Yes Minna Antis, MD  amLODipine (NORVASC) 10 MG tablet Take 1 tablet (10 mg total) by mouth daily. 11/15/17   Alford Highland, MD  chlordiazePOXIDE (LIBRIUM) 25 MG capsule Take 1 capsule (25 mg total) by mouth every morning. 11/15/17   Alford Highland, MD  docusate sodium (COLACE) 100 MG capsule Take 1 tablet once or twice daily as needed  for constipation while taking narcotic pain medicine 02/07/19   Loleta Rose, MD  folic acid (FOLVITE) 1 MG tablet Take 1 tablet (1 mg total) by mouth daily. 11/15/17   Alford Highland, MD  hydrocortisone-pramoxine (PROCTOFOAM [facility]) rectal foam Place 1 applicator rectally 2 (two) times daily. 02/07/19   Loleta Rose, MD  Multiple Vitamin (MULTIVITAMIN WITH MINERALS) TABS tablet Take 1 tablet by mouth daily.  11/15/17   Alford Highland, MD  thiamine 100 MG tablet Take 1 tablet (100 mg total) by mouth daily. 11/15/17   Alford Highland, MD    Physical Exam: Vitals:   05/04/20 1012 05/04/20 1249 05/04/20 1250 05/04/20 1552  BP: (!) 117/91 120/74 121/74 118/74  Pulse: 83 85 88 85  Resp: 16 17  18   Temp:   98 F (36.7 C)   TempSrc:   Oral   SpO2: 100% 98% 96% 97%  Weight:      Height:        Constitutional: NAD, calm, comfortable Vitals:   05/04/20 1012 05/04/20 1249 05/04/20 1250 05/04/20 1552  BP: (!) 117/91 120/74 121/74 118/74  Pulse: 83 85 88 85  Resp: 16 17  18   Temp:   98 F (36.7 C)   TempSrc:   Oral   SpO2: 100% 98% 96% 97%  Weight:      Height:       HEAD: noted multiple abrasion on left face Eyes: PERRL, lids and conjunctivae normal ENMT: Mucous membranes are moist. Posterior pharynx clear of any exudate or lesions.Normal dentition.  Neck: normal, supple, no masses, no thyromegaly Respiratory: clear to auscultation bilaterally, no wheezing, no crackles. Normal respiratory effort. No accessory muscle use.  Cardiovascular: Regular rate and rhythm, no murmurs / rubs / gallops. No extremity edema. 2+ pedal pulses. No carotid bruits.  Abdomen: no tenderness, no masses palpated. No hepatosplenomegaly. Bowel sounds positive.  Musculoskeletal: tenderness along ribs on left anterior/lateral chest, no clubbing / cyanosis. No joint deformity upper and lower extremities. Good ROM, no contractures. Normal muscle tone.  Skin: no rashes, lesions, ulcers. No induration Neurologic: CN 2-12 grossly intact. Sensation intact,Strength 5/5 in all 4. No tremors Psychiatric: Normal judgment and insight. Alert and oriented x 3. Normal mood.    Labs on Admission: I have personally reviewed following labs and imaging studies  CBC: Recent Labs  Lab 05/04/20 1415  WBC 8.8  HGB 14.6  HCT 42.9  MCV 105.9*  PLT 266   Basic Metabolic Panel: Recent Labs  Lab 05/04/20 1415  NA 136  K 4.4   CL 103  CO2 22  GLUCOSE 113*  BUN 10  CREATININE 0.69  CALCIUM 9.0   GFR: Estimated Creatinine Clearance: 88 mL/min (by C-G formula based on SCr of 0.69 mg/dL). Liver Function Tests: Recent Labs  Lab 05/04/20 1415  AST 38  ALT 17  ALKPHOS 72  BILITOT 0.5  PROT 8.2*  ALBUMIN 4.1   No results for input(s): LIPASE, AMYLASE in the last 168 hours. No results for input(s): AMMONIA in the last 168 hours. Coagulation Profile: No results for input(s): INR, PROTIME in the last 168 hours. Cardiac Enzymes: No results for input(s): CKTOTAL, CKMB, CKMBINDEX, TROPONINI in the last 168 hours. BNP (last 3 results) No results for input(s): PROBNP in the last 8760 hours. HbA1C: No results for input(s): HGBA1C in the last 72 hours. CBG: No results for input(s): GLUCAP in the last 168 hours. Lipid Profile: No results for input(s): CHOL, HDL, LDLCALC, TRIG, CHOLHDL, LDLDIRECT in the last  72 hours. Thyroid Function Tests: No results for input(s): TSH, T4TOTAL, FREET4, T3FREE, THYROIDAB in the last 72 hours. Anemia Panel: No results for input(s): VITAMINB12, FOLATE, FERRITIN, TIBC, IRON, RETICCTPCT in the last 72 hours. Urine analysis: No results found for: COLORURINE, APPEARANCEUR, LABSPEC, PHURINE, GLUCOSEU, HGBUR, BILIRUBINUR, KETONESUR, PROTEINUR, UROBILINOGEN, NITRITE, LEUKOCYTESUR  Radiological Exams on Admission: CT Head Wo Contrast  Result Date: 05/04/2020 CLINICAL DATA:  49 year old female status post fall. Pain and abrasions. EXAM: CT HEAD WITHOUT CONTRAST TECHNIQUE: Contiguous axial images were obtained from the base of the skull through the vertex without intravenous contrast. COMPARISON:  Head CT 09/23/2019.  Face CT today reported separately. FINDINGS: Brain: Cerebral volume is within normal limits for age. No midline shift, ventriculomegaly, mass effect, evidence of mass lesion, intracranial hemorrhage or evidence of cortically based acute infarction. Gray-white matter  differentiation is within normal limits throughout the brain. Faint dystrophic calcifications in the bilateral basal ganglia. Vascular: No suspicious intracranial vascular hyperdensity. Skull: Stable.  No fracture identified. Sinuses/Orbits: Bubbly opacity in the small right sphenoid sinus is unchanged. Other Visualized paranasal sinuses and mastoids are stable and well pneumatized. Other: No orbit or scalp soft tissue injury identified. Mildly Disconjugate gaze. IMPRESSION: Stable and normal for age non contrast CT appearance of the brain. No acute traumatic injury identified. Electronically Signed   By: Odessa FlemingH  Hall M.D.   On: 05/04/2020 08:35   CT Chest Wo Contrast  Result Date: 05/04/2020 CLINICAL DATA:  Fall with left-sided chest pain. EXAM: CT CHEST WITHOUT CONTRAST TECHNIQUE: Multidetector CT imaging of the chest was performed following the standard protocol without IV contrast. COMPARISON:  Radiography same day FINDINGS: Cardiovascular: The heart is enlarged. No pericardial fluid. There is coronary artery calcification. No aortic atherosclerotic calcification is seen. Mediastinum/Nodes: No mass or lymphadenopathy. Lungs/Pleura: Minimal emphysematous changes are seen in the upper lobes. There is mild dependent atelectasis at the lung bases, left more than right. No pneumothorax or hemothorax. Upper Abdomen: Negative Musculoskeletal: Multiple old healed anterior rib fractures on the left affecting ribs 3, 4 and 5. Acute rib fracture of the left posterolateral fifth, sixth and seventh ribs, nondisplaced. No rib fracture seen on the right. No thoracic spinal fracture. IMPRESSION: 1. Acute nondisplaced fractures of the left posterolateral fifth, sixth and seventh ribs. No pneumothorax or hemothorax. 2. Multiple old healed anterior rib fractures on the left affecting anterior ribs 3, 4 and 5. 3. Cardiomegaly. Coronary artery calcification. 4. Minimal emphysematous changes of the upper lobes. Mild dependent  atelectasis at the lung bases, left more than right. Emphysema (ICD10-J43.9). Electronically Signed   By: Paulina FusiMark  Shogry M.D.   On: 05/04/2020 09:40   DG Chest Portable 1 View  Result Date: 05/04/2020 CLINICAL DATA:  Shortness of breath and chest pain. EXAM: PORTABLE CHEST 1 VIEW COMPARISON:  Sep 15, 2016 chest radiograph and chest CT FINDINGS: There is atelectatic change in the left base. Lungs elsewhere clear. Heart is borderline enlarged with pulmonary vascularity normal. Aorta is mildly prominent. No pneumothorax. No bone lesions. IMPRESSION: Mild left base atelectasis. Lungs elsewhere clear. Borderline cardiac prominence. Aorta mildly prominent, a finding likely indicative of chronic hypertension. Electronically Signed   By: Bretta BangWilliam  Woodruff III M.D.   On: 05/04/2020 08:23   CT Maxillofacial Wo Contrast  Result Date: 05/04/2020 CLINICAL DATA:  49 year old female status post fall. Pain and abrasions. EXAM: CT MAXILLOFACIAL WITHOUT CONTRAST TECHNIQUE: Multidetector CT imaging of the maxillofacial structures was performed. Multiplanar CT image reconstructions were also generated. COMPARISON:  Head CT  today reported separately. FINDINGS: Osseous: Absent dentition. Mandible intact and normally located. No maxilla, zygoma or pterygoid plate fracture identified. There are mildly angulated bilateral nasal bone fractures (series 5, image 28), although age indeterminate with mild if any overlying soft tissue swelling. Central skull base appears intact. Visible calvarium intact. Visible cervical vertebrae intact. Orbits: Intact orbital walls. Disconjugate gaze but otherwise the orbits soft tissues appear normal. Sinuses: Mild chronic bubbly opacity in the right sphenoid sinuses, otherwise clear. Soft tissues: Negative visible noncontrast larynx, pharynx, parapharyngeal spaces, retropharyngeal space, sublingual space, submandibular spaces, masticator and parotid spaces. No upper cervical lymphadenopathy. Limited  intracranial: Negative. IMPRESSION: 1. Mild age indeterminate bilateral nasal bone fractures. 2. No other acute traumatic injury identified in the face. Electronically Signed   By: Odessa Fleming M.D.   On: 05/04/2020 08:39    EKG: Independently reviewed. n/a  Assessment/Plan Acute nondisplaced fractures of the left posterolateral fifth, sixth and seventh ribs s/p fall -pain medication with anti-inflammatory , muscle relaxants, opioids as needed  -pulmonary toilet  -PT/OT for discharge needs   Severe ETOH abuse with intoxication   -place on tele , ciwa with prn ativan taper , adjust taper base on clinical course  -will need social work f/u on discharge to assist with etoh cessation   Hx of HTN  -patient states he has been of medication for months  -currently bp borderline  -given amlodipine in ed  -continue amlodipine in am if needed.   DVT prophylaxis: scd  Code Status:FULL Family Communication: n/a Disposition Plan: patient  expected to be admitted less than 2 midnightsConsults called: n/a Admission status: tele   Lurline Del MD [facility]  If 7PM-7AM, please contact night-coverage [email or web address] Password [facility]  05/04/2020, 4:19 PM

## 2020-05-04 NOTE — Discharge Instructions (Addendum)
You have been seen in the emergency department after a fall.  You have suffered multiple rib fractures on the left side.  Please use your prescribed pain medication as needed but only as written.  Do not drive or drink alcohol while taking pain medication.  Please use your incentive spirometer several times per hour while awake for the next 7 days to help prevent pneumonia.  Return to the emergency department for any worsening chest pain, fever, shortness of breath, or any other symptom personally concerning to yourself. You have also suffered abrasions to the face.  Please use your prescribed bacitracin ointment twice daily for the next 7 days to help prevent infection and to aid in the healing process.   Patient is advised to abstain from drinking alcohol. Pt also advised not to drink and drive

## 2020-05-04 NOTE — ED Provider Notes (Addendum)
[facility] Emergency Department Provider Note  Time seen: 7:56 AM  I have reviewed the triage vital signs and the nursing notes.   HISTORY  Chief Complaint Fall (No loc, mech fall, left face abrasion , left chest/rib pain , )   HPI Lindsay Price is a 49 y.o. female with a past medical history of alcohol abuse presents to the emergency department after a fall.  According to the patient she was attempting to walk down a hill when she slipped falling on a grassy hill then landing ultimately on the sidewalk below.  Patient does admit to alcohol use.  Patient denies passing out.  Patient does have abrasions to her face but her main complaint is pain to her left chest wall.  EMS states initially she was hypoxic in the 80s and was placed on a couple liters of oxygen.  Currently satting in the low to mid 90s on room air in the emergency department.  Denies any recent cough shortness of breath fever nausea vomiting.   Past Medical History:  Diagnosis Date  . Alcoholism [facility])     Patient Active Problem List   Diagnosis Date Noted  . Amitriptyline overdose of undetermined intent 11/13/2017  . Alcohol abuse 11/13/2017  . Substance induced mood disorder (HCC) 11/13/2017    Past Surgical History:  Procedure Laterality Date  . NO PAST SURGERIES      Prior to Admission medications   Medication Sig Start Date End Date Taking? Authorizing Provider  amLODipine (NORVASC) 10 MG tablet Take 1 tablet (10 mg total) by mouth daily. 11/15/17   Alford Highland, MD  chlordiazePOXIDE (LIBRIUM) 25 MG capsule Take 1 capsule (25 mg total) by mouth every morning. 11/15/17   Alford Highland, MD  docusate sodium (COLACE) 100 MG capsule Take 1 tablet once or twice daily as needed for constipation while taking narcotic pain medicine 02/07/19   Loleta Rose, MD  folic acid (FOLVITE) 1 MG tablet Take 1 tablet (1 mg total) by mouth daily. 11/15/17   Alford Highland, MD   hydrocortisone-pramoxine (PROCTOFOAM [facility]) rectal foam Place 1 applicator rectally 2 (two) times daily. 02/07/19   Loleta Rose, MD  Multiple Vitamin (MULTIVITAMIN WITH MINERALS) TABS tablet Take 1 tablet by mouth daily. 11/15/17   Alford Highland, MD  thiamine 100 MG tablet Take 1 tablet (100 mg total) by mouth daily. 11/15/17   Alford Highland, MD    No Known Allergies  History reviewed. No pertinent family history.  Social History Social History   Tobacco Use  . Smoking status: Current Every Day Smoker    Packs/day: 0.50    Types: Cigarettes  . Smokeless tobacco: Never Used  Vaping Use  . Vaping Use: Never used  Substance Use Topics  . Alcohol use: Yes    Alcohol/week: 98.0 standard drinks    Types: 28 Cans of beer, 70 Shots of liquor per week  . Drug use: Not Currently    Types: Marijuana    Review of Systems Constitutional: Negative for fever. Cardiovascular: Positive for left chest wall pain.  Moderate, sharp worse with palpation. Respiratory: Negative for shortness of breath.  Negative for cough. Gastrointestinal: Negative for abdominal pain Musculoskeletal: Negative for musculoskeletal complaints Skin: Abrasions to left face Neurological: Negative for headache All other ROS negative  ____________________________________________   PHYSICAL EXAM:  VITAL SIGNS: ED Triage Vitals  Enc Vitals Group     BP --      Pulse Rate 05/04/20 0753 (!) 104  Resp 05/04/20 0753 17     Temp 05/04/20 0753 98.1 F (36.7 C)     Temp Source 05/04/20 0753 Oral     SpO2 05/04/20 0753 98 %     Weight 05/04/20 0754 165 lb 5.5 oz (75 kg)     Height 05/04/20 0754 5\' 7"  (1.702 m)     Head Circumference --      Peak Flow --      Pain Score 05/04/20 0754 7     Pain Loc --      Pain Edu? --      Excl. in GC? --    Constitutional: Alert and oriented. Well appearing and in no distress. Eyes: Normal exam ENT      Head: Normocephalic and atraumatic.      Mouth/Throat: Mucous  membranes are moist. Cardiovascular: Normal rate, regular rhythm.  Respiratory: Normal respiratory effort without tachypnea nor retractions. Breath sounds are clear  Gastrointestinal: Soft and nontender. No distention.   Musculoskeletal: Nontender with normal range of motion in all extremities.  Neurologic:  Normal speech and language. No gross focal neurologic deficits Skin: Patient has several small abrasions to her left face.  No lacerations.  No large hematomas. Psychiatric: Mood and affect are normal.   ____________________________________________   RADIOLOGY  CT scans are negative for acute abnormality besides possible nasal bone fracture. Chest x-ray negative for acute abnormality.  ____________________________________________   INITIAL IMPRESSION / ASSESSMENT AND PLAN / ED COURSE  Pertinent labs & imaging results that were available during my care of the patient were reviewed by me and considered in my medical decision making (see chart for details).   Patient presents to the emergency department after mechanical fall down a hill.  Does admit to alcohol use this morning.  Patient has abrasions to her left face, her only complaint is pain to her left anterior chest.  Patient has moderate chest wall tenderness to this area.  Given the fall and abrasions we will obtain CT scan of the head as well as a chest x-ray to evaluate.  Overall patient appears well in no distress is able to answer all questions appropriately and follow commands.  CT scans are negative for acute abnormality besides possible nasal bone fracture.  X-ray negative for acute abnormality.  We will proceed with a CT scan of the chest given high likelihood of nondisplaced fractures.    CT of the chest confirms 3 nondisplaced fractures of the left anterior ribs.  Patient has received pain medication in the emergency department.  Currently satting 97 to 100% on room air.  I discussed admission for pain control versus  discharge.  Patient wishes to go home.  We will treat the patient's pain and discharge with an incentive spirometer and PCP follow-up.  Discussed return precautions.  Patient agreeable plan of care.  On attempted discharge patient is unable to get out of bed even with assistance unable to get into the chair due to left chest wall pain.  We will discussed with the hospitalist for admission for pain control.  05/06/20 was evaluated in Emergency Department on 05/04/2020 for the symptoms described in the history of present illness. She was evaluated in the context of the global COVID-19 pandemic, which necessitated consideration that the patient might be at risk for infection with the SARS-CoV-2 virus that causes COVID-19. Institutional protocols and algorithms that pertain to the evaluation of patients at risk for COVID-19 are in a state of rapid change based on  information released by regulatory bodies including the CDC and federal and state organizations. These policies and algorithms were followed during the patient's care in the ED.  ____________________________________________   FINAL CLINICAL IMPRESSION(S) / ED DIAGNOSES  Fall Abrasions Rib fractures   Minna Antis, MD 05/04/20 0102    Minna Antis, MD 05/04/20 1242    Minna Antis, MD 05/04/20 1344

## 2020-05-04 NOTE — ED Triage Notes (Signed)
Pt fell mechanical , left rib/chest pain , left face abrasions , no loc , no thinners

## 2020-05-05 DIAGNOSIS — W19XXXA Unspecified fall, initial encounter: Secondary | ICD-10-CM | POA: Diagnosis not present

## 2020-05-05 DIAGNOSIS — S2242XA Multiple fractures of ribs, left side, initial encounter for closed fracture: Secondary | ICD-10-CM | POA: Diagnosis not present

## 2020-05-05 LAB — CBC
HCT: 39.4 % (ref 36.0–46.0)
Hemoglobin: 13.1 g/dL (ref 12.0–15.0)
MCH: 36 pg — ABNORMAL HIGH (ref 26.0–34.0)
MCHC: 33.2 g/dL (ref 30.0–36.0)
MCV: 108.2 fL — ABNORMAL HIGH (ref 80.0–100.0)
Platelets: 218 10*3/uL (ref 150–400)
RBC: 3.64 MIL/uL — ABNORMAL LOW (ref 3.87–5.11)
RDW: 13.3 % (ref 11.5–15.5)
WBC: 5.3 10*3/uL (ref 4.0–10.5)
nRBC: 0 % (ref 0.0–0.2)

## 2020-05-05 LAB — COMPREHENSIVE METABOLIC PANEL
ALT: 13 U/L (ref 0–44)
AST: 26 U/L (ref 15–41)
Albumin: 3.5 g/dL (ref 3.5–5.0)
Alkaline Phosphatase: 61 U/L (ref 38–126)
Anion gap: 7 (ref 5–15)
BUN: 15 mg/dL (ref 6–20)
CO2: 26 mmol/L (ref 22–32)
Calcium: 8.6 mg/dL — ABNORMAL LOW (ref 8.9–10.3)
Chloride: 103 mmol/L (ref 98–111)
Creatinine, Ser: 0.64 mg/dL (ref 0.44–1.00)
GFR, Estimated: >60 mL/min (ref >60)
Glucose, Bld: 88 mg/dL (ref 70–99)
Potassium: 4.3 mmol/L (ref 3.5–5.1)
Sodium: 136 mmol/L (ref 135–145)
Total Bilirubin: 0.7 mg/dL (ref 0.3–1.2)
Total Protein: 7 g/dL (ref 6.5–8.1)

## 2020-05-05 LAB — HEMOGLOBIN A1C
Hgb A1c MFr Bld: 4.9 % (ref 4.8–5.6)
Mean Plasma Glucose: 93.93 mg/dL

## 2020-05-05 LAB — HIV ANTIBODY (ROUTINE TESTING W REFLEX): HIV Screen 4th Generation wRfx: NONREACTIVE

## 2020-05-05 LAB — VITAMIN B12: Vitamin B-12: 316 pg/mL (ref 180–914)

## 2020-05-05 MED ORDER — THIAMINE HCL 100 MG PO TABS: 100.0000 mg | ORAL_TABLET | Freq: Every day | ORAL | Status: DC

## 2020-05-05 MED ORDER — IBUPROFEN 400 MG PO TABS: 400.0000 mg | ORAL_TABLET | Freq: Four times a day (QID) | ORAL | Status: DC | PRN

## 2020-05-05 MED ORDER — OXYCODONE HCL 5 MG PO TABS
5.0000 mg | ORAL_TABLET | Freq: Four times a day (QID) | ORAL | Status: DC | PRN
Start: 2020-05-05 — End: 2020-05-05

## 2020-05-05 MED ORDER — AMLODIPINE BESYLATE 10 MG PO TABS: 10.0000 mg | ORAL_TABLET | Freq: Every day | ORAL | 0 refills | Status: DC

## 2020-05-05 MED ORDER — FOLIC ACID 1 MG PO TABS: 1.0000 mg | ORAL_TABLET | Freq: Every day | ORAL | Status: DC

## 2020-05-05 MED ORDER — ADULT MULTIVITAMIN W/MINERALS CH: 1.0000 | ORAL_TABLET | Freq: Every day | ORAL | Status: DC

## 2020-05-05 MED ORDER — IBUPROFEN 400 MG PO TABS: 400.0000 mg | ORAL_TABLET | Freq: Four times a day (QID) | ORAL | 0 refills | Status: DC | PRN

## 2020-05-05 NOTE — Progress Notes (Signed)
Mobility Specialist - Progress Note   05/05/20 1400  Mobility  Activity Ambulated in hall;Ambulated to bathroom  Level of Assistance Moderate assist, patient does 50-74%  Assistive Device Front wheel walker  Distance Ambulated (ft) 110 ft  Mobility Response Tolerated well  Mobility performed by Mobility specialist  $Mobility charge 1 Mobility    Pre Mobility: 92 HR, 93% SpO2 Post Mobility: 101 HR, 93% SpO2   Pt was sitting in recliner upon arrival utilizing room air. Pt agreed to session. Pt c/o pain in ribs "10/10", per discussion with nurse, pain medication was administered prior to session. Pt initially denied nausea or fatigue. Pt was able to stand to RW with modA and extra time. Pt ambulated 110' in room/hallway with very slow gait and decreased speed. During ambulation, pt mentioned that she would often shift her weight over to R side for pain management, relying more on AD use. Pt c/o generalized weakness specifically in BUE. Pt also c/o SOB and fatigue, O2 > 90% throughout. One standing rest break was taken. After ambulation, pt was able to get to the bathroom but relied heavily on railing and mobility tech for assistance to sit at lowered height (BSC not in room). Pt also demonstrated unsafe technique to perform peri-care on self. Overall, pt tolerated session well. Pt was left in bed with all needs in reach and alarm set. Nurse notified of performance.    Filiberto Pinks Mobility Specialist 05/05/20, 2:26 PM

## 2020-05-05 NOTE — Discharge Summary (Signed)
[facility] - [place] [facility]   PATIENT NAME: Lindsay Price    MR#:  [id number]  DATE OF BIRTH:  January 14, 1971  DATE OF ADMISSION:  05/04/2020 ADMITTING PHYSICIAN: Lurline Del, MD  DATE OF DISCHARGE: 05/05/2020  PRIMARY CARE PHYSICIAN: [facility]    ADMISSION DIAGNOSIS:  Rib fracture [S22.39XA] Abrasion [T14.8XXA] Fall, initial encounter [id number]] Closed fracture of multiple ribs of left side, initial encounter [S22.42XA]  DISCHARGE DIAGNOSIS:  left 5,6,7 rib fracture status post mechanical history of chronic alcoholism  SECONDARY DIAGNOSIS:   Past Medical History:  Diagnosis Date  . Alcoholism [facility])     HOSPITAL COURSE:  Lindsay Price is a 49 y.o. female with medical history significant of  Severe etoh abuse , who presents to ed s/p fall while intoxicated. Per patient she was walking down a hill when she slipped and fell landing on the sidewalk below. She notes she did hit her head but had no LOC, but states mainly she had pain on her left chest pain.  Acute nondisplaced fractures of the left posterolateral fifth, sixth and seventh ribs s/p fall -pain medication with anti-inflammatory , muscle relaxants -pulmonary toilet  -patient to ambulate with mobility therapist prior to discharge. -- CT head unremarkable. -pt was rxed norco from ER  Chronic ETOH abuse  --will need social work f/u on discharge to assist with etoh cessation -- patient given all resources however according to [facility] patient is not much interested at present. -- Patient is advised to stop drinking alcohol.. I also advised her not to drink and drive. -- No signs of alcohol withdrawal. She is scoring zero on CIWA  Hx of HTN  -patient states he has been of medication for months  -currently bp borderline  -cont amlodipine   DVT prophylaxis: scd  Code Status:FULL Family Communication: n/a Disposition Plan: patient  expected to be admitted  less than 2 midnightsConsults called: n/a Admission status: tele   CONSULTS OBTAINED:    DRUG ALLERGIES:  No Known Allergies  DISCHARGE MEDICATIONS:   Allergies as of 05/05/2020   No Known Allergies     Medication List    STOP taking these medications   docusate sodium 100 MG capsule Commonly known as: Colace   Proctofoam HC rectal foam Generic drug: hydrocortisone-pramoxine     TAKE these medications   amLODipine 10 MG tablet Commonly known as: NORVASC Take 1 tablet (10 mg total) by mouth daily.   bacitracin ointment Apply to affected area twice daily   folic acid 1 MG tablet Commonly known as: FOLVITE Take 1 tablet (1 mg total) by mouth daily.   ibuprofen 400 MG tablet Commonly known as: ADVIL Take 1 tablet (400 mg total) by mouth every 6 (six) hours as needed for mild pain or moderate pain (or Fever >/= 101).   multivitamin with minerals Tabs tablet Take 1 tablet by mouth daily.   oxyCODONE-acetaminophen 5-325 MG tablet Commonly known as: Percocet Take 1 tablet by mouth every 4 (four) hours as needed for severe pain.   thiamine 100 MG tablet Take 1 tablet (100 mg total) by mouth daily.       If you experience worsening of your admission symptoms, develop shortness of breath, life threatening emergency, suicidal or homicidal thoughts you must seek medical attention immediately by calling 911 or calling your MD immediately  if symptoms less severe.  You Must read complete instructions/literature along with all the possible adverse reactions/side effects for all the  Medicines you take and that have been prescribed to you. Take any new Medicines after you have completely understood and accept all the possible adverse reactions/side effects.   Please note  You were cared for by a hospitalist during your hospital stay. If you have any questions about your discharge medications or the care you received while you were in the hospital after you are discharged,  you can call the unit and asked to speak with the hospitalist on call if the hospitalist that took care of you is not available. Once you are discharged, your primary care physician will handle any further medical issues. Please note that NO REFILLS for any discharge medications will be authorized once you are discharged, as it is imperative that you return to your primary care physician (or establish a relationship with a primary care physician if you do not have one) for your aftercare needs so that they can reassess your need for medications and monitor your lab values. Today   SUBJECTIVE  complains of left sided chest pain on movement. As any tremors. Alert and oriented  VITAL SIGNS:  Blood pressure (!) 132/92, pulse 79, temperature 97.7 F (36.5 C), temperature source Oral, resp. rate 20, height 5\' 7"  (1.702 m), weight 75 kg, last menstrual period 08/16/2016, SpO2 96 %.  I/O:  No intake or output data in the 24 hours ending 05/05/20 1305  PHYSICAL EXAMINATION:  GENERAL:  49 y.o.-year-old patient lying in the bed with no acute distress.  HEENT: Head atraumatic, normocephalic. Oropharynx and nasopharynx clear. Abrasion on the left forehead and cheek NECK:  Supple, no jugular venous distention. No thyroid enlargement, no tenderness.  LUNGS: Normal breath sounds bilaterally, no wheezing, rales,rhonchi or crepitation. No use of accessory muscles of respiration. tenderness left Rib cage CARDIOVASCULAR: S1, S2 normal. No murmurs, rubs, or gallops.  ABDOMEN: Soft, non-tender, non-distended. Bowel sounds present. No organomegaly or mass.  EXTREMITIES: No pedal edema, cyanosis, or clubbing.  NEUROLOGIC: Cranial nerves II through XII are intact. Muscle strength 5/5 in all extremities. Sensation intact. Gait not checked.  PSYCHIATRIC:  patient is alert and oriented x 3.  SKIN: No obvious rash, lesion, or ulcer.   DATA REVIEW:   CBC  Recent Labs  Lab 05/05/20 0516  WBC 5.3  HGB 13.1  HCT  39.4  PLT 218    Chemistries  Recent Labs  Lab 05/05/20 0516  NA 136  K 4.3  CL 103  CO2 26  GLUCOSE 88  BUN 15  CREATININE 0.64  CALCIUM 8.6*  AST 26  ALT 13  ALKPHOS 61  BILITOT 0.7    Microbiology Results   No results found for this or any previous visit (from the past 240 hour(s)).  RADIOLOGY:  CT Head Wo Contrast  Result Date: 05/04/2020 CLINICAL DATA:  49 year old female status post fall. Pain and abrasions. EXAM: CT HEAD WITHOUT CONTRAST TECHNIQUE: Contiguous axial images were obtained from the base of the skull through the vertex without intravenous contrast. COMPARISON:  Head CT 09/23/2019.  Face CT today reported separately. FINDINGS: Brain: Cerebral volume is within normal limits for age. No midline shift, ventriculomegaly, mass effect, evidence of mass lesion, intracranial hemorrhage or evidence of cortically based acute infarction. Gray-white matter differentiation is within normal limits throughout the brain. Faint dystrophic calcifications in the bilateral basal ganglia. Vascular: No suspicious intracranial vascular hyperdensity. Skull: Stable.  No fracture identified. Sinuses/Orbits: Bubbly opacity in the small right sphenoid sinus is unchanged. Other Visualized paranasal sinuses and mastoids are  stable and well pneumatized. Other: No orbit or scalp soft tissue injury identified. Mildly Disconjugate gaze. IMPRESSION: Stable and normal for age non contrast CT appearance of the brain. No acute traumatic injury identified. Electronically Signed   By: Odessa Fleming M.D.   On: 05/04/2020 08:35   CT Chest Wo Contrast  Result Date: 05/04/2020 CLINICAL DATA:  Fall with left-sided chest pain. EXAM: CT CHEST WITHOUT CONTRAST TECHNIQUE: Multidetector CT imaging of the chest was performed following the standard protocol without IV contrast. COMPARISON:  Radiography same day FINDINGS: Cardiovascular: The heart is enlarged. No pericardial fluid. There is coronary artery calcification.  No aortic atherosclerotic calcification is seen. Mediastinum/Nodes: No mass or lymphadenopathy. Lungs/Pleura: Minimal emphysematous changes are seen in the upper lobes. There is mild dependent atelectasis at the lung bases, left more than right. No pneumothorax or hemothorax. Upper Abdomen: Negative Musculoskeletal: Multiple old healed anterior rib fractures on the left affecting ribs 3, 4 and 5. Acute rib fracture of the left posterolateral fifth, sixth and seventh ribs, nondisplaced. No rib fracture seen on the right. No thoracic spinal fracture. IMPRESSION: 1. Acute nondisplaced fractures of the left posterolateral fifth, sixth and seventh ribs. No pneumothorax or hemothorax. 2. Multiple old healed anterior rib fractures on the left affecting anterior ribs 3, 4 and 5. 3. Cardiomegaly. Coronary artery calcification. 4. Minimal emphysematous changes of the upper lobes. Mild dependent atelectasis at the lung bases, left more than right. Emphysema (ICD10-J43.9). Electronically Signed   By: Paulina Fusi M.D.   On: 05/04/2020 09:40   DG Chest Portable 1 View  Result Date: 05/04/2020 CLINICAL DATA:  Shortness of breath and chest pain. EXAM: PORTABLE CHEST 1 VIEW COMPARISON:  Sep 15, 2016 chest radiograph and chest CT FINDINGS: There is atelectatic change in the left base. Lungs elsewhere clear. Heart is borderline enlarged with pulmonary vascularity normal. Aorta is mildly prominent. No pneumothorax. No bone lesions. IMPRESSION: Mild left base atelectasis. Lungs elsewhere clear. Borderline cardiac prominence. Aorta mildly prominent, a finding likely indicative of chronic hypertension. Electronically Signed   By: Bretta Bang III M.D.   On: 05/04/2020 08:23   CT Maxillofacial Wo Contrast  Result Date: 05/04/2020 CLINICAL DATA:  49 year old female status post fall. Pain and abrasions. EXAM: CT MAXILLOFACIAL WITHOUT CONTRAST TECHNIQUE: Multidetector CT imaging of the maxillofacial structures was performed.  Multiplanar CT image reconstructions were also generated. COMPARISON:  Head CT today reported separately. FINDINGS: Osseous: Absent dentition. Mandible intact and normally located. No maxilla, zygoma or pterygoid plate fracture identified. There are mildly angulated bilateral nasal bone fractures (series 5, image 28), although age indeterminate with mild if any overlying soft tissue swelling. Central skull base appears intact. Visible calvarium intact. Visible cervical vertebrae intact. Orbits: Intact orbital walls. Disconjugate gaze but otherwise the orbits soft tissues appear normal. Sinuses: Mild chronic bubbly opacity in the right sphenoid sinuses, otherwise clear. Soft tissues: Negative visible noncontrast larynx, pharynx, parapharyngeal spaces, retropharyngeal space, sublingual space, submandibular spaces, masticator and parotid spaces. No upper cervical lymphadenopathy. Limited intracranial: Negative. IMPRESSION: 1. Mild age indeterminate bilateral nasal bone fractures. 2. No other acute traumatic injury identified in the face. Electronically Signed   By: Odessa Fleming M.D.   On: 05/04/2020 08:39     CODE STATUS:     Code Status Orders  (From admission, onward)         Start     Ordered   05/04/20 1640  Full code  Continuous        05/04/20 1644  Code Status History    Date Active Date Inactive Code Status Order ID Comments User Context   01/14/2018 2359 01/15/2018 1505 Full Code [id number]  Darci CurrentBrown, Livingston N, MD ED   11/13/2017 0400 11/14/2017 1918 Full Code [id number]  Barbaraann RondoSridharan, Prasanna, MD Inpatient   Advance Care Planning Activity       TOTAL TIME TAKING CARE OF THIS PATIENT: *35* minutes.    Enedina FinnerSona Solita Macadam M.D  [facility]    CC: Primary care physician; [facility]

## 2020-05-05 NOTE — Progress Notes (Signed)
Patient ID: Lindsay Price, female   DOB: 08-Nov-1970, 49 y.o.   MRN: [id number] patient ambulated with mobility therapist about hundred feet. She was independent most of the time. Sats 90% on room air. Couple times became unsteady due to pain on the left rib cage. Used  Walker.  Discussed discharge plan again with patient. TOC aware. Patient is not homebound and hence therapy cannot be arranged. She is not appropriate for rehab. I have asked her to use her walker that was provided two years ago. According to [facility] she will have to pay out-of-pocket since insurance would not pay for another walker since it is less than five years. Patient also is advised to use a cane if needed. She lives with her boyfriend who patient tells me can help her out.  Patient was again strongly advised to stop drinking alcohol. She is also advised to follow-up with PCP [facility] which has been established however she has been noncompliant and follow-up.  I had discussed with patient's daughter Lindsay Price on the phone early here for discharge planning. Her narcotic prescription has been called by the ED to her pharmacy which patient is advised to pick up.

## 2020-05-05 NOTE — TOC Initial Note (Signed)
Transition of Care [facility]) - Initial/Assessment Note    Patient Details  Name: Lindsay Price MRN: [id number] Date of Birth: 1970-11-15  [facility] CM/SW Contact:    Chapman Fitch, RN Phone Number: 05/05/2020, 12:50 PM  Clinical Narrative:                 Patient admitted from home with rib fracture Patient states she lives at home with husband  Currently working at [employer]  Previous admission I set patient up with a new [facility] appointment at [facility] as this is who she is assigned through [facility]. Patient states she did not follow up, and is not interested in me making another appointment   Previous admission I provided patient with a RW.  She states "yeah its probably somewhere still"  Discussed alcohol use with patient.   She states that she no longer drinks liquor and drinks a 40 oz of beer, but not every night   Discussed inpatient vs outpatient substance abuse resources.  She was agreeable to accept printed list of resources, but states she doesn't believe she is interested at this time, because she doesn't want it to interfere with her returning to work  [facility] consult completed.  Please re consult if indicated   Expected Discharge Plan: Home/Self Care Barriers to Discharge: No Barriers Identified   Patient Goals and CMS Choice        Expected Discharge Plan and Services Expected Discharge Plan: Home/Self Care   Discharge Planning Services: CM Consult   Living arrangements for the past 2 months: Single Family Home                                      Prior Living Arrangements/Services Living arrangements for the past 2 months: Single Family Home Lives with:: Spouse Patient language and need for interpreter reviewed:: Yes Do you feel safe going back to the place where you live?: Yes      Need for Family Participation in Patient Care: Yes (Comment) Care giver support system in place?: Yes (comment) Current home services:  DME Criminal Activity/Legal Involvement Pertinent to Current Situation/Hospitalization: No - Comment as needed  Activities of Daily Living Home Assistive Devices/Equipment: None ADL Screening (condition at time of admission) Patient's cognitive ability adequate to safely complete daily activities?: Yes Is the patient deaf or have difficulty hearing?: No Does the patient have difficulty seeing, even when wearing glasses/contacts?: No Does the patient have difficulty concentrating, remembering, or making decisions?: No Patient able to express need for assistance with ADLs?: Yes Does the patient have difficulty dressing or bathing?: No Independently performs ADLs?: Yes (appropriate for developmental age) Does the patient have difficulty walking or climbing stairs?: No Weakness of Legs: None Weakness of Arms/Hands: None  Permission Sought/Granted                  Emotional Assessment       Orientation: : Oriented to Self,Oriented to Place,Oriented to  Time,Oriented to Situation   Psych Involvement: No (comment)  Admission diagnosis:  Rib fracture [S22.39XA] Abrasion [T14.8XXA] Fall, initial encounter [id number]] Closed fracture of multiple ribs of left side, initial encounter [S22.42XA] Patient Active Problem List   Diagnosis Date Noted  . Rib fracture 05/04/2020  . Amitriptyline overdose of undetermined intent 11/13/2017  . Alcohol abuse 11/13/2017  . Substance induced mood disorder (HCC) 11/13/2017   PCP:  Patient,  No Pcp Per Pharmacy:   [facility] [phone] Dan Humphreys, [place] - [address] [place] [address] Phone: [phone] Fax: [phone]     Social Determinants of Health (SDOH) Interventions    Readmission Risk Interventions No flowsheet data found.

## 2020-05-05 NOTE — Progress Notes (Signed)
Lindsay Price to be D/C'd home per MD order.  Discussed prescriptions and follow up appointments with the patient. Prescriptions given to patient, medication list explained in detail. Pt verbalized understanding.  Allergies as of 05/05/2020   No Known Allergies      Medication List     STOP taking these medications    docusate sodium 100 MG capsule Commonly known as: Colace   Proctofoam HC rectal foam Generic drug: hydrocortisone-pramoxine       TAKE these medications    amLODipine 10 MG tablet Commonly known as: NORVASC Take 1 tablet (10 mg total) by mouth daily.   bacitracin ointment Apply to affected area twice daily   folic acid 1 MG tablet Commonly known as: FOLVITE Take 1 tablet (1 mg total) by mouth daily.   ibuprofen 400 MG tablet Commonly known as: ADVIL Take 1 tablet (400 mg total) by mouth every 6 (six) hours as needed for mild pain or moderate pain (or Fever >/= 101).   multivitamin with minerals Tabs tablet Take 1 tablet by mouth daily.   oxyCODONE-acetaminophen 5-325 MG tablet Commonly known as: Percocet Take 1 tablet by mouth every 4 (four) hours as needed for severe pain.   thiamine 100 MG tablet Take 1 tablet (100 mg total) by mouth daily.        Vitals:   05/05/20 0118 05/05/20 0800  BP: (!) 171/99 (!) 132/92  Pulse: 82 79  Resp: 20   Temp: 98.1 F (36.7 C) 97.7 F (36.5 C)  SpO2: 99% 96%    Skin clean, dry and intact  except for some abrasions around her left eye. IV catheter discontinued intact. Site without signs and symptoms of complications. Dressing and pressure applied. Pt denies pain at this time. No complaints noted.  An After Visit Summary was printed and given to the patient. Patient escorted via WC, and D/C home via private auto.  Lindsay Price Lindsay Price

## 2020-05-24 DIAGNOSIS — W19XXXA Unspecified fall, initial encounter: Secondary | ICD-10-CM

## 2020-09-19 ENCOUNTER — Other Ambulatory Visit: Payer: Self-pay

## 2020-09-19 ENCOUNTER — Ambulatory Visit
Admission: EM | Admit: 2020-09-19 | Discharge: 2020-09-19 | Disposition: A | Discharge status: Home / Self Care | Payer: Medicaid Other | Attending: Family Medicine | Admitting: Family Medicine

## 2020-09-19 ENCOUNTER — Ambulatory Visit (INDEPENDENT_AMBULATORY_CARE_PROVIDER_SITE_OTHER): Payer: Medicaid Other

## 2020-09-19 DIAGNOSIS — M79605 Pain in left leg: Secondary | ICD-10-CM

## 2020-09-19 DIAGNOSIS — M170 Bilateral primary osteoarthritis of knee: Secondary | ICD-10-CM | POA: Diagnosis not present

## 2020-09-19 DIAGNOSIS — M79604 Pain in right leg: Secondary | ICD-10-CM

## 2020-09-19 DIAGNOSIS — G8929 Other chronic pain: Secondary | ICD-10-CM

## 2020-09-19 DIAGNOSIS — M25562 Pain in left knee: Secondary | ICD-10-CM

## 2020-09-19 DIAGNOSIS — M25561 Pain in right knee: Secondary | ICD-10-CM | POA: Diagnosis not present

## 2020-09-19 MED ORDER — MELOXICAM 15 MG PO TABS: 15.0000 mg | ORAL_TABLET | Freq: Every day | ORAL | 0 refills | Status: DC | PRN

## 2020-09-19 NOTE — ED Triage Notes (Signed)
Pt c/o bilateral knee, bilateral hand, and bilateral hip pain. Been dealing with it for years but has been worse over the last couple days.

## 2020-09-19 NOTE — Discharge Instructions (Signed)
Mediation as prescribed.  Please call [facility] Orthopedics [phone]) OR [facility] [phone]) for an appt.  Take care  Dr. Adriana Simas

## 2020-09-21 NOTE — ED Provider Notes (Signed)
[facility]    CSN: [id number] Arrival date & time: 09/19/20  1142      History   Chief Complaint Chief Complaint  Patient presents with  . Knee Pain   HPI  50 year old female presents with the above complaint.  Patient reports ongoing pain.  She has had chronic pain for years.  She is most bothered by bilateral knee pain as well as bilateral hip pain.  She states that her knee pain makes it difficult for her to ambulate.  She states that she has frequent falls as her knees "give way".  Patient states that she is used over-the-counter analgesics without improvement.  Patient reports a history of arthritis but states that its been years since she had an x-ray.  Pain currently 9/10 in severity.  No other complaints at this time.  Past Medical History:  Diagnosis Date  . Alcoholism [facility])     Patient Active Problem List   Diagnosis Date Noted  . Fall   . Rib fracture 05/04/2020  . Amitriptyline overdose of undetermined intent 11/13/2017  . Alcohol abuse 11/13/2017  . Substance induced mood disorder (HCC) 11/13/2017    Past Surgical History:  Procedure Laterality Date  . NO PAST SURGERIES      OB History   No obstetric history on file.      Home Medications    Prior to Admission medications   Medication Sig Start Date End Date Taking? Authorizing Provider  meloxicam (MOBIC) 15 MG tablet Take 1 tablet (15 mg total) by mouth daily as needed for pain. 09/19/20  Yes Rivky Clendenning G, DO  amLODipine (NORVASC) 10 MG tablet Take 1 tablet (10 mg total) by mouth daily. 05/05/20 09/19/20  Enedina Finner, MD   Social History Social History   Tobacco Use  . Smoking status: Current Every Day Smoker    Packs/day: 0.50    Types: Cigarettes  . Smokeless tobacco: Never Used  Vaping Use  . Vaping Use: Never used  Substance Use Topics  . Alcohol use: Yes    Alcohol/week: 98.0 standard drinks    Types: 28 Cans of beer, 70 Shots of liquor per week  . Drug use: Not  Currently    Types: Marijuana     Allergies   Patient has no known allergies.   Review of Systems Review of Systems  Constitutional: Negative.   Musculoskeletal:       Knee pain, hip pain   Physical Exam Triage Vital Signs ED Triage Vitals  Enc Vitals Group     BP 09/19/20 1341 (!) 137/103     Pulse Rate 09/19/20 1341 100     Resp 09/19/20 1341 16     Temp 09/19/20 1341 98.6 F (37 C)     Temp Source 09/19/20 1341 Oral     SpO2 09/19/20 1341 99 %     Weight 09/19/20 1340 160 lb (72.6 kg)     Height --      Head Circumference --      Peak Flow --      Pain Score 09/19/20 1339 9     Pain Loc --      Pain Edu? --      Excl. in GC? --    Updated Vital Signs BP (!) 137/103 (BP Location: Left Arm)   Pulse 100   Temp 98.6 F (37 C) (Oral)   Resp 16   Wt 72.6 kg   LMP 08/16/2016   SpO2 99%   BMI  25.06 kg/m   Visual Acuity Right Eye Distance:   Left Eye Distance:   Bilateral Distance:    Right Eye Near:   Left Eye Near:    Bilateral Near:     Physical Exam Constitutional:      Appearance: She is not ill-appearing.     Comments: Chronically ill-appearing female.  Appears older than stated age.  HENT:     Head: Normocephalic and atraumatic.  Eyes:     General:        Right eye: No discharge.        Left eye: No discharge.     Conjunctiva/sclera: Conjunctivae normal.  Pulmonary:     Effort: Pulmonary effort is normal. No respiratory distress.  Musculoskeletal:     Comments: Right and left knees -no anterior joint line tenderness.  Mild swelling noted.  Ligaments intact.  Right and left hips -good range of motion.  Tenderness over the greater trochanters.  Neurological:     Mental Status: She is alert.  Psychiatric:        Mood and Affect: Mood normal.    UC Treatments / Results  Labs (all labs ordered are listed, but only abnormal results are displayed) Labs Reviewed - No data to display  EKG   Radiology DG Knee Complete 4 Views  Left  Result Date: 09/19/2020 CLINICAL DATA:  Chronic pain. EXAM: LEFT KNEE - COMPLETE 4+ VIEW COMPARISON:  None. FINDINGS: There is no evidence of acute fracture or dislocation. Tricompartmental degenerative changes are most advanced in the medial compartment where there is subchondral sclerosis and osteophyte formation. There is slightly lesser involvement within the patellofemoral compartment. There is a small knee joint effusion. IMPRESSION: Tricompartmental osteoarthritis, age advanced in the medial compartment. Small joint effusion. Electronically Signed   By: Carey Bullocks M.D.   On: 09/19/2020 15:05   DG Knee Complete 4 Views Right  Result Date: 09/19/2020 CLINICAL DATA:  Chronic pain. EXAM: RIGHT KNEE - COMPLETE 4+ VIEW COMPARISON:  None. FINDINGS: The mineralization and alignment are normal. There is no evidence of acute fracture or dislocation. Age advanced medial compartment osteoarthritis with bone-on-bone apposition, osteophytes and subchondral eburnation. Lesser patellofemoral degenerative changes and a small joint effusion. IMPRESSION: Age advanced medial compartment osteoarthritis with lesser involvement of the patellofemoral compartment. No acute osseous findings. Electronically Signed   By: Carey Bullocks M.D.   On: 09/19/2020 15:06   DG Hip Unilat W or Wo Pelvis 2-3 Views Left  Result Date: 09/19/2020 CLINICAL DATA:  Chronic pain. EXAM: DG HIP (WITH OR WITHOUT PELVIS) 2-3V LEFT COMPARISON:  None. FINDINGS: AP and frog-leg lateral views of the left hip. The mineralization and alignment are normal. There is no evidence of acute fracture or dislocation. No evidence of femoral head avascular necrosis. There are sacroiliac degenerative changes bilaterally with subchondral sclerosis, but no definite erosive changes or ankylosis. IMPRESSION: Negative left hip radiographs. Bilateral sacroiliac degenerative changes. Electronically Signed   By: Carey Bullocks M.D.   On: 09/19/2020 15:07    DG Hip Unilat W or Wo Pelvis 2-3 Views Right  Result Date: 09/19/2020 CLINICAL DATA:  Chronic pain. EXAM: DG HIP (WITH OR WITHOUT PELVIS) 2-3V RIGHT COMPARISON:  None. FINDINGS: AP pelvis with AP and frog-leg lateral views of the right hip. The mineralization and alignment are normal. There is no evidence of acute fracture or dislocation. No evidence of femoral head avascular necrosis. The right hip joint spaces are preserved. There are sacroiliac degenerative changes bilaterally with subchondral sclerosis,  but no definite erosive changes or ankylosis. Bilateral pelvic calcifications are likely phleboliths. IMPRESSION: Negative right hip radiographs. Bilateral sacroiliac degenerative changes. Electronically Signed   By: Carey Bullocks M.D.   On: 09/19/2020 15:08    Procedures Procedures (including critical care time)  Medications Ordered in UC Medications - No data to display  Initial Impression / Assessment and Plan / UC Course  I have reviewed the triage vital signs and the nursing notes.  Pertinent labs & imaging results that were available during my care of the patient were reviewed by me and considered in my medical decision making (see chart for details).    50 year old female presents with bilateral hip and knee pain.  X-rays were obtained of the hip as well as the knees.  Interpretation: Hip films negative.  No significant osteoarthritis of the hips.  She does have degenerative changes at the SI joints.  Knee x-rays revealed significant osteoarthritis particularly in the medial compartment.  Treating patient with meloxicam.  I advised her that she needs to see orthopedics.  May need injections and possibly joint replacement in the near future.  Final Clinical Impressions(s) / UC Diagnoses   Final diagnoses:  Primary osteoarthritis of both knees     Discharge Instructions     Mediation as prescribed.  Please call [facility] clinic Orthopedics [phone]) OR [facility]  [phone]) for an appt.  Take care  Dr. Adriana Simas    ED Prescriptions    Medication Sig Dispense Auth. Provider   meloxicam (MOBIC) 15 MG tablet Take 1 tablet (15 mg total) by mouth daily as needed for pain. 30 tablet Tommie Sams, DO     PDMP not reviewed this encounter.   Everlene Other [place], [place] 09/21/20 [phone]

## 2020-10-19 ENCOUNTER — Emergency Department: Payer: Medicaid Other

## 2020-10-19 ENCOUNTER — Emergency Department
Admission: EM | Admit: 2020-10-19 | Discharge: 2020-10-19 | Disposition: A | Discharge status: Home / Self Care | Payer: Medicaid Other | Attending: Emergency Medicine | Admitting: Emergency Medicine

## 2020-10-19 ENCOUNTER — Other Ambulatory Visit: Payer: Self-pay

## 2020-10-19 ENCOUNTER — Encounter: Payer: Self-pay | Admitting: Emergency Medicine

## 2020-10-19 DIAGNOSIS — R0602 Shortness of breath: Secondary | ICD-10-CM

## 2020-10-19 DIAGNOSIS — M1380 Other specified arthritis, unspecified site: Secondary | ICD-10-CM | POA: Insufficient documentation

## 2020-10-19 DIAGNOSIS — F1010 Alcohol abuse, uncomplicated: Secondary | ICD-10-CM | POA: Diagnosis not present

## 2020-10-19 DIAGNOSIS — F17210 Nicotine dependence, cigarettes, uncomplicated: Secondary | ICD-10-CM | POA: Insufficient documentation

## 2020-10-19 DIAGNOSIS — F410 Panic disorder [episodic paroxysmal anxiety] without agoraphobia: Secondary | ICD-10-CM | POA: Diagnosis not present

## 2020-10-19 DIAGNOSIS — F419 Anxiety disorder, unspecified: Secondary | ICD-10-CM | POA: Insufficient documentation

## 2020-10-19 DIAGNOSIS — M1990 Unspecified osteoarthritis, unspecified site: Secondary | ICD-10-CM

## 2020-10-19 DIAGNOSIS — Z720 Tobacco use: Secondary | ICD-10-CM

## 2020-10-19 HISTORY — DX: Reserved for concepts with insufficient information to code with codable children: IMO0002

## 2020-10-19 HISTORY — DX: Unspecified osteoarthritis, unspecified site: M19.90

## 2020-10-19 HISTORY — DX: Systemic lupus erythematosus, unspecified: M32.9

## 2020-10-19 LAB — CBC WITH DIFFERENTIAL/PLATELET
Abs Immature Granulocytes: 0.01 10*3/uL (ref 0.00–0.07)
Basophils Absolute: 0 10*3/uL (ref 0.0–0.1)
Basophils Relative: 1 %
Eosinophils Absolute: 0.1 10*3/uL (ref 0.0–0.5)
Eosinophils Relative: 2 %
HCT: 38.4 % (ref 36.0–46.0)
Hemoglobin: 13.5 g/dL (ref 12.0–15.0)
Immature Granulocytes: 0 %
Lymphocytes Relative: 65 %
Lymphs Abs: 2.8 10*3/uL (ref 0.7–4.0)
MCH: 36.9 pg — ABNORMAL HIGH (ref 26.0–34.0)
MCHC: 35.2 g/dL (ref 30.0–36.0)
MCV: 104.9 fL — ABNORMAL HIGH (ref 80.0–100.0)
Monocytes Absolute: 0.4 10*3/uL (ref 0.1–1.0)
Monocytes Relative: 8 %
Neutro Abs: 1 10*3/uL — ABNORMAL LOW (ref 1.7–7.7)
Neutrophils Relative %: 24 %
Platelets: 226 10*3/uL (ref 150–400)
RBC: 3.66 MIL/uL — ABNORMAL LOW (ref 3.87–5.11)
RDW: 13.3 % (ref 11.5–15.5)
WBC: 4.3 10*3/uL (ref 4.0–10.5)
nRBC: 0 % (ref 0.0–0.2)

## 2020-10-19 LAB — COMPREHENSIVE METABOLIC PANEL
ALT: 22 U/L (ref 0–44)
AST: 38 U/L (ref 15–41)
Albumin: 3.9 g/dL (ref 3.5–5.0)
Alkaline Phosphatase: 65 U/L (ref 38–126)
Anion gap: 10 (ref 5–15)
BUN: 11 mg/dL (ref 6–20)
CO2: 27 mmol/L (ref 22–32)
Calcium: 8.4 mg/dL — ABNORMAL LOW (ref 8.9–10.3)
Chloride: 104 mmol/L (ref 98–111)
Creatinine, Ser: 0.68 mg/dL (ref 0.44–1.00)
GFR, Estimated: >60 mL/min (ref >60)
Glucose, Bld: 100 mg/dL — ABNORMAL HIGH (ref 70–99)
Potassium: 3.8 mmol/L (ref 3.5–5.1)
Sodium: 141 mmol/L (ref 135–145)
Total Bilirubin: 0.6 mg/dL (ref 0.3–1.2)
Total Protein: 7.4 g/dL (ref 6.5–8.1)

## 2020-10-19 LAB — TROPONIN I (HIGH SENSITIVITY): Troponin I (High Sensitivity): 2 ng/L (ref <18)

## 2020-10-19 MED ORDER — ACETAMINOPHEN 500 MG PO TABS
1000.0000 mg | ORAL_TABLET | Freq: Once | ORAL | Status: AC
  Administered 2020-10-19: 1000 mg via ORAL
  Filled 2020-10-19: qty 2

## 2020-10-19 MED ORDER — IBUPROFEN 400 MG PO TABS
400.0000 mg | ORAL_TABLET | Freq: Once | ORAL | Status: AC
  Administered 2020-10-19: 400 mg via ORAL
  Filled 2020-10-19: qty 1

## 2020-10-19 NOTE — ED Triage Notes (Signed)
Pt to ED from [facility], staff bringing pt over stated that they are not really sure what is going on but that pt cannot breath. Pt arrives alert and visually upset, pt has nasal canula in her both at 15 L.  Pt reports that she had appt with neurology today, pt reports that she arrived to her appt and there was some problems with check in, when she got to the neurology office they told her that she was late and they did not know if they could see her. Pt reports that she got upset and started crying then felt like she was not able to breath.  Pt brought to triage room and placed on room air. Pt 100% on room air, pt has calmed down.

## 2020-10-19 NOTE — ED Provider Notes (Signed)
[facility] Emergency Department Provider Note  ____________________________________________   Event Date/Time   First MD Initiated Contact with Patient 10/19/20 1241     (approximate)  I have reviewed the triage vital signs and the nursing notes.   HISTORY  Chief Complaint Panic Attack   HPI Lindsay Price is a 50 y.o. female with a past medical history of arthritis in her wrists and knees that has been ongoing for several years as well as recent diagnosis of lupus about 2 or 3 weeks ago, tobacco abuse, EtOH abuse drinking approximately 1/5 of liquor per day, THC use and anxiety who presents for assessment after being referred from clinic with concerns for anxiety and shortness of breath.  Patient states she had an appointment today for her ankle arthritis but went to the wrong place in the hospital and was told when she got to the right clinic that she could no longer be seen.  She states she merely became extremely anxious and felt like she could not breathe.  She states she currently feels much better and feels her breathing is back to normal.  She denies any change in her chronic cough or dyspnea with exertion, any associated chest pain, Donnell pain, nausea, vomiting, diarrhea, dysuria, rash or change in her chronic pain in her extremities.  No new pain in her head ears eyes nose throat or back.  No other acute concerns at this time.         Past Medical History:  Diagnosis Date   Alcoholism (HCC)    Arthritis    Lupus (HCC)     Patient Active Problem List   Diagnosis Date Noted   Fall    Rib fracture 05/04/2020   Amitriptyline overdose of undetermined intent 11/13/2017   Alcohol abuse 11/13/2017   Substance induced mood disorder (HCC) 11/13/2017    Past Surgical History:  Procedure Laterality Date   NO PAST SURGERIES      Prior to Admission medications   Medication Sig Start Date End Date Taking? Authorizing Provider  meloxicam (MOBIC)  15 MG tablet Take 1 tablet (15 mg total) by mouth daily as needed for pain. 09/19/20   Tommie Sams, DO  amLODipine (NORVASC) 10 MG tablet Take 1 tablet (10 mg total) by mouth daily. 05/05/20 09/19/20  Enedina Finner, MD    Allergies Patient has no known allergies.  No family history on file.  Social History Social History   Tobacco Use   Smoking status: Every Day    Packs/day: 0.50    Pack years: 0.00    Types: Cigarettes   Smokeless tobacco: Never  Vaping Use   Vaping Use: Never used  Substance Use Topics   Alcohol use: Yes    Alcohol/week: 98.0 standard drinks    Types: 28 Cans of beer, 70 Shots of liquor per week   Drug use: Not Currently    Types: Marijuana    Review of Systems  Review of Systems  Constitutional:  Negative for chills and fever.  HENT:  Negative for sore throat.   Eyes:  Negative for pain.  Respiratory:  Positive for cough (chronic) and shortness of breath. Negative for stridor.   Cardiovascular:  Negative for chest pain.  Gastrointestinal:  Negative for vomiting.  Genitourinary:  Negative for dysuria.  Musculoskeletal:  Positive for joint pain and myalgias.  Skin:  Negative for rash.  Neurological:  Negative for seizures, loss of consciousness and headaches.  Psychiatric/Behavioral:  Negative for suicidal  ideas. The patient is nervous/anxious.   All other systems reviewed and are negative.    ____________________________________________   PHYSICAL EXAM:  VITAL SIGNS: ED Triage Vitals  Enc Vitals Group     BP 10/19/20 1105 (!) 116/91     Pulse Rate 10/19/20 1105 (!) 120     Resp 10/19/20 1105 16     Temp 10/19/20 1109 98 F (36.7 C)     Temp Source 10/19/20 1109 Oral     SpO2 10/19/20 1105 100 %     Weight 10/19/20 1106 165 lb (74.8 kg)     Height 10/19/20 1106 5\' 11"  (1.803 m)     Head Circumference --      Peak Flow --      Pain Score 10/19/20 1106 10     Pain Loc --      Pain Edu? --      Excl. in GC? --    Vitals:   10/19/20  1105 10/19/20 1109  BP: (!) 116/91   Pulse: (!) 120   Resp: 16   Temp:  98 F (36.7 C)  SpO2: 100%    Physical Exam Vitals and nursing note reviewed.  Constitutional:      General: She is not in acute distress.    Appearance: She is well-developed.  HENT:     Head: Normocephalic and atraumatic.     Right Ear: External ear normal.     Left Ear: External ear normal.     Nose: Nose normal.  Eyes:     Conjunctiva/sclera: Conjunctivae normal.  Cardiovascular:     Rate and Rhythm: Normal rate and regular rhythm.     Heart sounds: No murmur heard. Pulmonary:     Effort: Pulmonary effort is normal. No respiratory distress.     Breath sounds: Normal breath sounds.  Abdominal:     Palpations: Abdomen is soft.     Tenderness: There is no abdominal tenderness.  Musculoskeletal:     Cervical back: Neck supple.  Skin:    General: Skin is warm and dry.     Capillary Refill: Capillary refill takes less than 2 seconds.  Neurological:     Mental Status: She is alert and oriented to person, place, and time.     ____________________________________________   LABS (all labs ordered are listed, but only abnormal results are displayed)  Labs Reviewed  CBC WITH DIFFERENTIAL/PLATELET - Abnormal; Notable for the following components:      Result Value   RBC 3.66 (*)    MCV 104.9 (*)    MCH 36.9 (*)    Neutro Abs 1.0 (*)    All other components within normal limits  COMPREHENSIVE METABOLIC PANEL - Abnormal; Notable for the following components:   Glucose, Bld 100 (*)    Calcium 8.4 (*)    All other components within normal limits  TROPONIN I (HIGH SENSITIVITY)   ____________________________________________  EKG  Sinus tachycardia with ventricular rate of 102, normal axis, unremarkable intervals without clearance of acute ischemia or significant Arrhythmia. ____________________________________________  RADIOLOGY  ED MD interpretation: Chest x-ray remarkable for remote rib  fractures without evidence of pneumonia, thorax, effusion, edema or other clear acute intrathoracic process.  Official radiology report(s): DG Chest 2 View  Result Date: 10/19/2020 CLINICAL DATA:  Shortness of breath. EXAM: CHEST - 2 VIEW COMPARISON:  05/04/2020. FINDINGS: The heart size and mediastinal contours are within normal limits. Calcific atherosclerosis of the aorta. Both lungs are clear. No visible pleural effusions or  pneumothorax. Multiple displaced left rib fractures, likely remote. IMPRESSION: 1. No evidence of active cardiopulmonary disease. 2. Multiple displaced left rib fractures, likely remote. Electronically Signed   By: Feliberto Harts MD   On: 10/19/2020 13:54    ____________________________________________   PROCEDURES  Procedure(s) performed (including Critical Care):  .1-3 Lead EKG Interpretation  Date/Time: 10/19/2020 2:08 PM Performed by: Gilles Chiquito, MD Authorized by: Gilles Chiquito, MD     Interpretation: normal     ECG rate assessment: normal     Rhythm: sinus rhythm     Ectopy: none     Conduction: normal     ____________________________________________   INITIAL IMPRESSION / ASSESSMENT AND PLAN / ED COURSE      Patient presents for an episode of shortness of breath and anxiety that she states began after she found out that she was at the wrong location for appointment this morning and resolved by the time she has been seen by this examiner.  She states her appointment was for further evaluation of chronic pain in her joints.  On arrival she was noted to be tachycardic at 112 with otherwise stable vital signs on room air.  However on my assessment patient's heart rate is 80s on the monitor.  Overall unclear urology for the shortness of breath anxiety she experienced earlier today it certainly possible is related to some underlying anxiety likely exacerbated by her alcohol abuse THC use and possibly undiagnosed anxiety disorder.  No chest pain  given notably troponin reassuring EKG Evalose patient for ACS.  No significant arrhythmia identified.  Chest x-ray shows evidence of pneumothorax pneumonia effusion patient has no evidence on history exam or chest x-ray of heart failure.  While she was initially tachycardic I have a low suspicion for PE given symptoms began suddenly after she found out she cannot get her appointment and her tachycardia had resolved by time I saw her.  In addition she denies any chest pain states her shortness of breath has resolved after few minutes sitting in the emergency room.  On my assessment patient states she feels normal and wishes to go.  Offered her additional observation and counseled her extensively on importance of continuing tobacco cessation further decreasing alcohol use and getting help with anxiety.  She is amenable this.  Given stable vitals with otherwise reassuring exam and work-up I think discharge is reasonable with close outpatient PCP and RT follow-up.  Discharged stable condition.  Strict return precautions advised and discussed.        ____________________________________________   FINAL CLINICAL IMPRESSION(S) / ED DIAGNOSES  Final diagnoses:  SOB (shortness of breath)  Anxiety  Tobacco abuse  Alcohol abuse  Arthritis    Medications  acetaminophen (TYLENOL) tablet 1,000 mg (1,000 mg Oral Given 10/19/20 1342)  ibuprofen (ADVIL) tablet 400 mg (400 mg Oral Given 10/19/20 1342)     ED Discharge Orders     None        Note:  This document was prepared using Dragon voice recognition software and may include unintentional dictation errors.    Gilles Chiquito, MD 10/19/20 [phone]

## 2020-10-24 ENCOUNTER — Other Ambulatory Visit: Payer: Self-pay

## 2020-10-24 ENCOUNTER — Ambulatory Visit
Admission: EM | Admit: 2020-10-24 | Discharge: 2020-10-24 | Disposition: A | Discharge status: Home / Self Care | Payer: Medicaid Other | Attending: Emergency Medicine | Admitting: Emergency Medicine

## 2020-10-24 NOTE — ED Triage Notes (Signed)
Patient complains of bilateral pain that started a few weeks ago and then improved but has returned. States that her hands and wrist feel tight.

## 2020-11-02 ENCOUNTER — Ambulatory Visit
Admission: EM | Admit: 2020-11-02 | Discharge: 2020-11-02 | Disposition: A | Discharge status: Home / Self Care | Payer: Medicaid Other | Attending: Family Medicine | Admitting: Family Medicine

## 2020-11-02 ENCOUNTER — Other Ambulatory Visit: Payer: Self-pay

## 2020-11-02 DIAGNOSIS — R252 Cramp and spasm: Secondary | ICD-10-CM | POA: Diagnosis not present

## 2020-11-02 MED ORDER — TIZANIDINE HCL 4 MG PO TABS: 4.0000 mg | ORAL_TABLET | Freq: Three times a day (TID) | ORAL | 0 refills | Status: DC | PRN

## 2020-11-02 NOTE — Discharge Instructions (Addendum)
Lots of fluids.  Medication as directed.  Please follow up with orthopedics.  Take care  Dr. Adriana Simas

## 2020-11-02 NOTE — ED Triage Notes (Signed)
Pt c/o intermittent hand cramps for several weeks, along with numbness. Pt states this happens constantly and she can feel it coming on. Pt states she "has been hiding this for years".

## 2020-11-04 NOTE — ED Provider Notes (Signed)
[facility]    CSN: [id number] Arrival date & time: 11/02/20  1941      History   Chief Complaint Chief Complaint  Patient presents with   Hand Problem    bilateral    HPI  50 year old female presents with the above complaints.  Patient is very difficult to follow.  Patient appears intoxicated.  Patient states that she has had hand cramps for 20 years.  He states that she has been "hiding it".  Patient very upset about her recent visits to her specialist which then prompted her to be sent to the ER.  Patient reports that her pain is severe.  She has difficulty opening her hands.  She has to do it forcibly.  No relieving factors.  Patient complains of diffuse joint pain as well.  Past Medical History:  Diagnosis Date   Alcoholism (HCC)    Arthritis    Lupus (HCC)     Patient Active Problem List   Diagnosis Date Noted   Fall    Rib fracture 05/04/2020   Amitriptyline overdose of undetermined intent 11/13/2017   Alcohol abuse 11/13/2017   Substance induced mood disorder (HCC) 11/13/2017    Past Surgical History:  Procedure Laterality Date   NO PAST SURGERIES      OB History   No obstetric history on file.      Home Medications    Prior to Admission medications   Medication Sig Start Date End Date Taking? Authorizing Provider  meloxicam (MOBIC) 15 MG tablet Take 1 tablet (15 mg total) by mouth daily as needed for pain. 09/19/20  Yes Adreyan Carbajal G, DO  tiZANidine (ZANAFLEX) 4 MG tablet Take 1 tablet (4 mg total) by mouth every 8 (eight) hours as needed for muscle spasms. 11/02/20  Yes Braelynn Benning G, DO  amLODipine (NORVASC) 10 MG tablet Take 1 tablet (10 mg total) by mouth daily. 05/05/20 09/19/20  Enedina Finner, MD    Family History History reviewed. No pertinent family history.  Social History Social History   Tobacco Use   Smoking status: Every Day    Packs/day: 0.50    Pack years: 0.00    Types: Cigarettes   Smokeless tobacco: Never  Vaping  Use   Vaping Use: Never used  Substance Use Topics   Alcohol use: Yes    Alcohol/week: 98.0 standard drinks    Types: 28 Cans of beer, 70 Shots of liquor per week   Drug use: Not Currently    Types: Marijuana     Allergies   Patient has no known allergies.   Review of Systems Review of Systems Per HPI  Physical Exam Triage Vital Signs ED Triage Vitals  Enc Vitals Group     BP 11/02/20 1958 (!) 136/99     Pulse Rate 11/02/20 1958 (!) 101     Resp 11/02/20 1958 18     Temp 11/02/20 1958 98.5 F (36.9 C)     Temp Source 11/02/20 1958 Oral     SpO2 11/02/20 1958 100 %     Weight 11/02/20 1956 183 lb (83 kg)     Height 11/02/20 1956 5\' 11"  (1.803 m)     Head Circumference --      Peak Flow --      Pain Score 11/02/20 1955 10     Pain Loc --      Pain Edu? --      Excl. in GC? --    Updated Vital Signs BP 11/04/20)  136/99 (BP Location: Left Arm)   Pulse (!) 101   Temp 98.5 F (36.9 C) (Oral)   Resp 18   Ht 5\' 11"  (1.803 m)   Wt 83 kg   LMP 08/16/2016   SpO2 100%   BMI 25.52 kg/m   Visual Acuity Right Eye Distance:   Left Eye Distance:   Bilateral Distance:    Right Eye Near:   Left Eye Near:    Bilateral Near:     Physical Exam Constitutional:      General: She is not in acute distress.    Comments: Patient is alert.  HENT:     Head: Normocephalic and atraumatic.  Eyes:     General:        Right eye: No discharge.        Left eye: No discharge.     Conjunctiva/sclera: Conjunctivae normal.  Pulmonary:     Effort: Pulmonary effort is normal. No respiratory distress.  Musculoskeletal:     Comments: Spasms noted of the digits particular on the left hand.  When patient is distracted this seems to resolve.  Psychiatric:     Comments: Patient appears intoxicated.  She is difficult to follow.     UC Treatments / Results  Labs (all labs ordered are listed, but only abnormal results are displayed) Labs Reviewed - No data to  display  EKG   Radiology No results found.  Procedures Procedures (including critical care time)  Medications Ordered in UC Medications - No data to display  Initial Impression / Assessment and Plan / UC Course  I have reviewed the triage vital signs and the nursing notes.  Pertinent labs & imaging results that were available during my care of the patient were reviewed by me and considered in my medical decision making (see chart for details).    50 year old female presents with spasms of her hands.  This is a chronic problem.  Patient states that this has been going on for approximately 20 years.  Patient appears intoxicated.  Zanaflex as directed.    Final Clinical Impressions(s) / UC Diagnoses   Final diagnoses:  Spasms of the hands or feet     Discharge Instructions      Lots of fluids.  Medication as directed.  Please follow up with orthopedics.  Take care  Dr. 44    ED Prescriptions     Medication Sig Dispense Auth. Provider   tiZANidine (ZANAFLEX) 4 MG tablet Take 1 tablet (4 mg total) by mouth every 8 (eight) hours as needed for muscle spasms. 30 tablet Adriana Simas, DO      PDMP not reviewed this encounter.   Tommie Sams, Tommie Sams 11/04/20 [phone]

## 2020-11-08 ENCOUNTER — Other Ambulatory Visit: Payer: Self-pay | Admitting: Surgery

## 2020-11-15 ENCOUNTER — Inpatient Hospital Stay
Admission: RE | Admit: 2020-11-15 | Discharge: 2020-11-15 | Disposition: A | Discharge status: Home / Self Care | Payer: Medicaid Other | Source: Ambulatory Visit

## 2020-11-15 NOTE — Patient Instructions (Addendum)
Your procedure is scheduled on: Tuesday, July 19 Report to the Registration Desk on the 1st floor of the [facility] To find out your arrival time, please call [phone] between 1PM - 3PM on: Monday, July 18  REMEMBER: Instructions that are not followed completely may result in serious medical risk, up to and including death; or upon the discretion of your surgeon and anesthesiologist your surgery may need to be rescheduled.  Do not eat food after midnight the night before surgery.  No gum chewing, lozengers or hard candies.  You may however, drink CLEAR liquids up to 2 hours before you are scheduled to arrive for your surgery. Do not drink anything within 2 hours of your scheduled arrival time.  Clear liquids include: - water  - apple juice without pulp - gatorade (not RED, PURPLE, OR BLUE) - black coffee or tea (Do NOT add milk or creamers to the coffee or tea) Do NOT drink anything that is not on this list.  In addition, your doctor has ordered for you to drink the provided  Ensure Pre-Surgery Clear Carbohydrate Drink  Drinking this carbohydrate drink up to two hours before surgery helps to reduce insulin resistance and improve patient outcomes. Please complete drinking 2 hours prior to scheduled arrival time.  DO NOT TAKE ANY MEDICATIONS THE MORNING OF SURGERY  One week prior to surgery: STARTING TODAY, July 12 Stop MELOXICAM, Anti-inflammatories (NSAIDS) such as Advil, Aleve, Ibuprofen, Motrin, Naproxen, Naprosyn and Aspirin based products such as Excedrin, Goodys Powder, BC Powder. Stop ANY OVER THE COUNTER supplements until after surgery. You may however, continue to take Tylenol if needed for pain up until the day of surgery.  No Alcohol for 24 hours before or after surgery.  No Smoking including e-cigarettes for 24 hours prior to surgery.  No chewable tobacco products for at least 6 hours prior to surgery.  No nicotine patches on the day of surgery.  Do not use any  "recreational" drugs for at least a week prior to your surgery.  Please be advised that the combination of cocaine and anesthesia may have negative outcomes, up to and including death. If you test positive for cocaine, your surgery will be cancelled.  On the morning of surgery brush your teeth with toothpaste and water, you may rinse your mouth with mouthwash if you wish. Do not swallow any toothpaste or mouthwash.  Do not wear jewelry, make-up, hairpins, clips or nail polish.  Do not wear lotions, powders, or perfumes.   Do not shave body from the neck down 48 hours prior to surgery just in case you cut yourself which could leave a site for infection.  Also, freshly shaved skin may become irritated if using the CHG soap.  Contact lenses, hearing aids and dentures may not be worn into surgery.  Do not bring valuables to the hospital. [facility] is not responsible for any missing/lost belongings or valuables.   Use CHG Soap as directed on instruction sheet.  Bring your C-PAP to the hospital with you in case you may have to spend the night.   Notify your doctor if there is any change in your medical condition (cold, fever, infection).  Wear comfortable clothing (specific to your surgery type) to the hospital.  After surgery, you can help prevent lung complications by doing breathing exercises.  Take deep breaths and cough every 1-2 hours. Your doctor may order a device called an Incentive Spirometer to help you take deep breaths.  If you are  being admitted to the hospital overnight, leave your suitcase in the car. After surgery it may be brought to your room.  Please call the Pre-admissions Testing Dept. at [phone] if you have any questions about these instructions.  Surgery Visitation Policy:  Patients undergoing a surgery or procedure may have one family member or support person with them as long as that person is not COVID-19 positive or experiencing its symptoms.  That  person may remain in the waiting area during the procedure.  Inpatient Visitation:    Visiting hours are 7 a.m. to 8 p.m. Inpatients will be allowed two visitors daily. The visitors may change each day during the patient's stay. No visitors under the age of 43. Any visitor under the age of 19 must be accompanied by an adult. The visitor must pass COVID-19 screenings, use hand sanitizer when entering and exiting the patient's room and wear a mask at all times, including in the patient's room. Patients must also wear a mask when staff or their visitor are in the room. Masking is required regardless of vaccination status.

## 2020-11-17 ENCOUNTER — Other Ambulatory Visit: Payer: Self-pay

## 2020-11-17 ENCOUNTER — Encounter
Admission: RE | Admit: 2020-11-17 | Discharge: 2020-11-17 | Disposition: A | Discharge status: Home / Self Care | Payer: Medicaid Other | Source: Ambulatory Visit | Attending: Surgery | Admitting: Surgery

## 2020-11-17 DIAGNOSIS — Z20822 Contact with and (suspected) exposure to covid-19: Secondary | ICD-10-CM | POA: Diagnosis not present

## 2020-11-17 DIAGNOSIS — Z01812 Encounter for preprocedural laboratory examination: Secondary | ICD-10-CM | POA: Diagnosis present

## 2020-11-17 HISTORY — DX: Essential (primary) hypertension: I10

## 2020-11-17 LAB — TYPE AND SCREEN
ABO/RH(D): O POS
Antibody Screen: NEGATIVE

## 2020-11-17 LAB — URINALYSIS, ROUTINE W REFLEX MICROSCOPIC
Bilirubin Urine: NEGATIVE
Glucose, UA: NEGATIVE mg/dL
Hgb urine dipstick: NEGATIVE
Ketones, ur: NEGATIVE mg/dL
Leukocytes,Ua: NEGATIVE
Nitrite: NEGATIVE
Protein, ur: NEGATIVE mg/dL
Specific Gravity, Urine: 1.011 (ref 1.005–1.030)
pH: 5 (ref 5.0–8.0)

## 2020-11-17 LAB — SURGICAL PCR SCREEN
MRSA, PCR: NEGATIVE
Staphylococcus aureus: NEGATIVE

## 2020-11-17 NOTE — Patient Instructions (Signed)
Your procedure is scheduled on: November 22, 2020 Tuesday  Report to the Registration Desk on the 1st floor of the [facility] To find out your arrival time, please call [phone] between 1PM - 3PM on: November 21, 2020 MONDAY  REMEMBER: Instructions that are not followed completely may result in serious medical risk, up to and including death; or upon the discretion of your surgeon and anesthesiologist your surgery may need to be rescheduled.  Do not eat food after midnight the night before surgery.  No gum chewing, lozengers or hard candies.  You may however, drink CLEAR liquids up to 2 hours before you are scheduled to arrive for your surgery. Do not drink anything within 2 hours of your scheduled arrival time.  Clear liquids include: - water  - apple juice without pulp - gatorade (not RED, PURPLE, OR BLUE) - black coffee or tea (Do NOT add milk or creamers to the coffee or tea) Do NOT drink anything that is not on this list.  Type 1 and Type 2 diabetics should only drink water.  In addition, your doctor has ordered for you to drink the provided  Ensure Pre-Surgery Clear Carbohydrate Drink  Drinking this carbohydrate drink up to two hours before surgery helps to reduce insulin resistance and improve patient outcomes. Please complete drinking 2 hours prior to scheduled arrival time.  TAKE THESE MEDICATIONS THE MORNING OF SURGERY WITH A SIP OF WATER: TIZANIDINE   One week prior to surgery: Stop Anti-inflammatories (NSAIDS) such as Advil, Aleve, Ibuprofen, Motrin, Naproxen, Naprosyn and ASPIRIN OR Aspirin based products such as Excedrin, Goodys Powder, BC Powder.  DO NOT TAKE MELOXICAM  Stop ANY OVER THE COUNTER supplements until after surgery. You may however, continue to take Tylenol if needed for pain up until the day of surgery.  No Alcohol for 24 hours before or after surgery.  No Smoking including e-cigarettes for 24 hours prior to surgery.  No chewable tobacco products for  at least 6 hours prior to surgery.  No nicotine patches on the day of surgery.  Do not use any "recreational" drugs for at least a week prior to your surgery.  Please be advised that the combination of cocaine and anesthesia may have negative outcomes, up to and including death. If you test positive for cocaine, your surgery will be cancelled.  On the morning of surgery brush your teeth with toothpaste and water, you may rinse your mouth with mouthwash if you wish. Do not swallow any toothpaste or mouthwash.  Do not wear jewelry, make-up, hairpins, clips or nail polish.  Do not wear lotions, powders, or perfumes.   Do not shave body from the neck down 48 hours prior to surgery just in case you cut yourself which could leave a site for infection.  Also, freshly shaved skin may become irritated if using the CHG soap.  Contact lenses, hearing aids and dentures may not be worn into surgery.  Do not bring valuables to the hospital. [facility] is not responsible for any missing/lost belongings or valuables.   Use CHG Soap as directed on instruction sheet.  Notify your doctor if there is any change in your medical condition (cold, fever, infection).  Wear comfortable clothing (specific to your surgery type) to the hospital.  After surgery, you can help prevent lung complications by doing breathing exercises.  Take deep breaths and cough every 1-2 hours. Your doctor may order a device called an Incentive Spirometer to help you take deep breaths. When  coughing or sneezing, hold a pillow firmly against your incision with both hands. This is called "splinting." Doing this helps protect your incision. It also decreases belly discomfort.  If you are being admitted to the hospital overnight, YOU MAY BRING A SMALL BAG WITH YOU TO THE HOSPITAL  If you are being discharged the day of surgery, you will not be allowed to drive home. You will need a responsible adult (18 years or older) to drive you  home and stay with you that night.   If you are taking public transportation, you will need to have a responsible adult (18 years or older) with you. Please confirm with your physician that it is acceptable to use public transportation.   Please call the Pre-admissions Testing Dept. at [phone] if you have any questions about these instructions.  Surgery Visitation Policy:  Patients undergoing a surgery or procedure may have one family member or support person with them as long as that person is not COVID-19 positive or experiencing its symptoms.  That person may remain in the waiting area during the procedure.  Inpatient Visitation:    Visiting hours are 7 a.m. to 8 p.m. Inpatients will be allowed two visitors daily. The visitors may change each day during the patient's stay. No visitors under the age of 85. Any visitor under the age of 57 must be accompanied by an adult. The visitor must pass COVID-19 screenings, use hand sanitizer when entering and exiting the patient's room and wear a mask at all times, including in the patient's room. Patients must also wear a mask when staff or their visitor are in the room. Masking is required regardless of vaccination status.

## 2020-11-18 ENCOUNTER — Other Ambulatory Visit: Admission: RE | Admit: 2020-11-18 | Payer: Medicaid Other | Source: Ambulatory Visit

## 2020-11-18 LAB — SARS CORONAVIRUS 2 (TAT 6-24 HRS): SARS Coronavirus 2: NEGATIVE

## 2020-11-21 MED ORDER — FAMOTIDINE 20 MG PO TABS: 20.0000 mg | ORAL_TABLET | Freq: Once | ORAL | Status: AC

## 2020-11-21 MED ORDER — CEFAZOLIN SODIUM-DEXTROSE 2-4 GM/100ML-% IV SOLN
2.0000 g | INTRAVENOUS | Status: AC
  Administered 2020-11-22: 2 g via INTRAVENOUS

## 2020-11-22 ENCOUNTER — Other Ambulatory Visit: Payer: Self-pay

## 2020-11-22 ENCOUNTER — Encounter: Payer: Self-pay | Admitting: Surgery

## 2020-11-22 ENCOUNTER — Encounter
Admission: RE | Disposition: A | Discharge status: Home Health | Payer: Self-pay | Source: Home / Self Care | Attending: Surgery

## 2020-11-22 ENCOUNTER — Inpatient Hospital Stay: Payer: Medicaid Other | Admitting: Anesthesiology

## 2020-11-22 ENCOUNTER — Inpatient Hospital Stay: Payer: Medicaid Other

## 2020-11-22 ENCOUNTER — Inpatient Hospital Stay
Admission: RE | Admit: 2020-11-22 | Discharge: 2020-11-23 | DRG: 470 | Disposition: A | Discharge status: Home Health | Payer: Medicaid Other | Attending: Surgery | Admitting: Surgery

## 2020-11-22 DIAGNOSIS — I10 Essential (primary) hypertension: Secondary | ICD-10-CM | POA: Diagnosis present

## 2020-11-22 DIAGNOSIS — Z96652 Presence of left artificial knee joint: Secondary | ICD-10-CM

## 2020-11-22 DIAGNOSIS — M329 Systemic lupus erythematosus, unspecified: Secondary | ICD-10-CM | POA: Diagnosis present

## 2020-11-22 DIAGNOSIS — M1712 Unilateral primary osteoarthritis, left knee: Principal | ICD-10-CM | POA: Diagnosis present

## 2020-11-22 HISTORY — PX: TOTAL KNEE ARTHROPLASTY: SHX125

## 2020-11-22 LAB — URINE DRUG SCREEN, QUALITATIVE (ARMC ONLY)
Amphetamines, Ur Screen: NOT DETECTED
Barbiturates, Ur Screen: NOT DETECTED
Benzodiazepine, Ur Scrn: NOT DETECTED
Cannabinoid 50 Ng, Ur ~~LOC~~: NOT DETECTED
Cocaine Metabolite,Ur ~~LOC~~: NOT DETECTED
MDMA (Ecstasy)Ur Screen: NOT DETECTED
Methadone Scn, Ur: NOT DETECTED
Opiate, Ur Screen: NOT DETECTED
Phencyclidine (PCP) Ur S: NOT DETECTED
Tricyclic, Ur Screen: POSITIVE — AB

## 2020-11-22 LAB — ABO/RH: ABO/RH(D): O POS

## 2020-11-22 SURGERY — ARTHROPLASTY, KNEE, TOTAL
Anesthesia: Choice | Site: Knee | Laterality: Left

## 2020-11-22 MED ORDER — ONDANSETRON HCL 4 MG PO TABS: 4.0000 mg | ORAL_TABLET | Freq: Four times a day (QID) | ORAL | Status: DC | PRN

## 2020-11-22 MED ORDER — LACTATED RINGERS IV SOLN
INTRAVENOUS | Status: DC
Start: 2020-11-22 — End: 2020-11-22

## 2020-11-22 MED ORDER — LABETALOL HCL 5 MG/ML IV SOLN
INTRAVENOUS | Status: AC
  Administered 2020-11-22: 5 mg via INTRAVENOUS
  Filled 2020-11-22: qty 4

## 2020-11-22 MED ORDER — PROPOFOL 1000 MG/100ML IV EMUL
INTRAVENOUS | Status: AC
  Filled 2020-11-22: qty 100

## 2020-11-22 MED ORDER — LABETALOL HCL 5 MG/ML IV SOLN
5.0000 mg | INTRAVENOUS | Status: DC | PRN
  Administered 2020-11-22: 5 mg via INTRAVENOUS

## 2020-11-22 MED ORDER — ACETAMINOPHEN 325 MG PO TABS: 325.0000 mg | ORAL_TABLET | Freq: Four times a day (QID) | ORAL | Status: DC | PRN

## 2020-11-22 MED ORDER — MAGNESIUM HYDROXIDE 400 MG/5ML PO SUSP
30.0000 mL | Freq: Every day | ORAL | Status: DC | PRN
  Administered 2020-11-22: 30 mL via ORAL
  Filled 2020-11-22: qty 30

## 2020-11-22 MED ORDER — TRANEXAMIC ACID 1000 MG/10ML IV SOLN
INTRAVENOUS | Status: DC | PRN
  Administered 2020-11-22: 1000 mg via TOPICAL

## 2020-11-22 MED ORDER — KETOROLAC TROMETHAMINE 15 MG/ML IJ SOLN
15.0000 mg | Freq: Four times a day (QID) | INTRAMUSCULAR | Status: AC
  Administered 2020-11-22 – 2020-11-23 (×4): 15 mg via INTRAVENOUS
  Filled 2020-11-22 (×4): qty 1

## 2020-11-22 MED ORDER — PHENYLEPHRINE HCL (PRESSORS) 10 MG/ML IV SOLN
INTRAVENOUS | Status: AC
  Filled 2020-11-22: qty 1

## 2020-11-22 MED ORDER — DIPHENHYDRAMINE HCL 12.5 MG/5ML PO ELIX: 12.5000 mg | ORAL_SOLUTION | ORAL | Status: DC | PRN

## 2020-11-22 MED ORDER — FENTANYL CITRATE (PF) 100 MCG/2ML IJ SOLN
INTRAMUSCULAR | Status: AC
  Filled 2020-11-22: qty 2

## 2020-11-22 MED ORDER — TIZANIDINE HCL 4 MG PO TABS
4.0000 mg | ORAL_TABLET | Freq: Three times a day (TID) | ORAL | Status: DC | PRN
  Administered 2020-11-22: 4 mg via ORAL
  Filled 2020-11-22 (×2): qty 1

## 2020-11-22 MED ORDER — PROPOFOL 500 MG/50ML IV EMUL
INTRAVENOUS | Status: DC | PRN
  Administered 2020-11-22: 130 ug/kg/min via INTRAVENOUS
  Administered 2020-11-22: 200 ug/kg/min via INTRAVENOUS

## 2020-11-22 MED ORDER — HYDRALAZINE HCL 10 MG PO TABS
10.0000 mg | ORAL_TABLET | Freq: Four times a day (QID) | ORAL | Status: DC | PRN
  Administered 2020-11-22 – 2020-11-23 (×2): 10 mg via ORAL
  Filled 2020-11-22 (×5): qty 1

## 2020-11-22 MED ORDER — BUPIVACAINE LIPOSOME 1.3 % IJ SUSP
INTRAMUSCULAR | Status: AC
  Filled 2020-11-22: qty 20

## 2020-11-22 MED ORDER — MIDAZOLAM HCL 2 MG/2ML IJ SOLN
INTRAMUSCULAR | Status: AC
  Filled 2020-11-22: qty 2

## 2020-11-22 MED ORDER — ONDANSETRON HCL 4 MG/2ML IJ SOLN
4.0000 mg | Freq: Four times a day (QID) | INTRAMUSCULAR | Status: DC | PRN
  Administered 2020-11-23: 4 mg via INTRAVENOUS
  Filled 2020-11-22: qty 2

## 2020-11-22 MED ORDER — SODIUM CHLORIDE 0.9 % IV SOLN
INTRAVENOUS | Status: DC | PRN
  Administered 2020-11-22: 60 mL

## 2020-11-22 MED ORDER — KETOROLAC TROMETHAMINE 30 MG/ML IJ SOLN: 30.0000 mg | Freq: Once | INTRAMUSCULAR | Status: AC

## 2020-11-22 MED ORDER — SODIUM CHLORIDE 0.9 % IR SOLN
Status: DC | PRN
  Administered 2020-11-22: 3000 mL

## 2020-11-22 MED ORDER — ACETAMINOPHEN 500 MG PO TABS
1000.0000 mg | ORAL_TABLET | Freq: Once | ORAL | Status: AC
  Administered 2020-11-22: 1000 mg via ORAL

## 2020-11-22 MED ORDER — TRANEXAMIC ACID 1000 MG/10ML IV SOLN
INTRAVENOUS | Status: AC
  Filled 2020-11-22: qty 10

## 2020-11-22 MED ORDER — FLEET ENEMA 7-19 GM/118ML RE ENEM: 1.0000 | ENEMA | Freq: Once | RECTAL | Status: DC | PRN

## 2020-11-22 MED ORDER — CHLORHEXIDINE GLUCONATE 0.12 % MT SOLN
OROMUCOSAL | Status: AC
  Administered 2020-11-22: 15 mL via OROMUCOSAL
  Filled 2020-11-22: qty 15

## 2020-11-22 MED ORDER — ONDANSETRON HCL 4 MG/2ML IJ SOLN
INTRAMUSCULAR | Status: DC | PRN
Start: 2020-11-22 — End: 2020-11-22
  Administered 2020-11-22: 4 mg via INTRAVENOUS

## 2020-11-22 MED ORDER — BUPIVACAINE-EPINEPHRINE (PF) 0.5% -1:200000 IJ SOLN
INTRAMUSCULAR | Status: DC | PRN
  Administered 2020-11-22: 30 mL

## 2020-11-22 MED ORDER — CHLORHEXIDINE GLUCONATE 0.12 % MT SOLN: 15.0000 mL | Freq: Once | OROMUCOSAL | Status: AC

## 2020-11-22 MED ORDER — SODIUM CHLORIDE 0.9 % IV SOLN: INTRAVENOUS | Status: DC

## 2020-11-22 MED ORDER — OXYCODONE HCL 5 MG PO TABS
5.0000 mg | ORAL_TABLET | ORAL | Status: DC | PRN
  Administered 2020-11-22 (×2): 10 mg via ORAL
  Administered 2020-11-23: 5 mg via ORAL
  Administered 2020-11-23: 10 mg via ORAL
  Filled 2020-11-22: qty 2
  Filled 2020-11-22: qty 1
  Filled 2020-11-22 (×2): qty 2

## 2020-11-22 MED ORDER — BUPIVACAINE-EPINEPHRINE (PF) 0.5% -1:200000 IJ SOLN
INTRAMUSCULAR | Status: AC
  Filled 2020-11-22: qty 30

## 2020-11-22 MED ORDER — OXYCODONE HCL 5 MG PO TABS: 5.0000 mg | ORAL_TABLET | Freq: Once | ORAL | Status: DC | PRN

## 2020-11-22 MED ORDER — METOCLOPRAMIDE HCL 10 MG PO TABS: 5.0000 mg | ORAL_TABLET | Freq: Three times a day (TID) | ORAL | Status: DC | PRN

## 2020-11-22 MED ORDER — KETOROLAC TROMETHAMINE 30 MG/ML IJ SOLN
INTRAMUSCULAR | Status: AC
  Administered 2020-11-22: 30 mg via INTRAVENOUS
  Filled 2020-11-22: qty 1

## 2020-11-22 MED ORDER — BUPIVACAINE HCL (PF) 0.5 % IJ SOLN
INTRAMUSCULAR | Status: DC | PRN
  Administered 2020-11-22: 3 mL

## 2020-11-22 MED ORDER — ACETAMINOPHEN 10 MG/ML IV SOLN
INTRAVENOUS | Status: AC
  Filled 2020-11-22: qty 100

## 2020-11-22 MED ORDER — DOCUSATE SODIUM 100 MG PO CAPS
100.0000 mg | ORAL_CAPSULE | Freq: Two times a day (BID) | ORAL | Status: DC
  Administered 2020-11-22 – 2020-11-23 (×2): 100 mg via ORAL
  Filled 2020-11-22 (×2): qty 1

## 2020-11-22 MED ORDER — HYDROMORPHONE HCL 1 MG/ML IJ SOLN: 0.2500 mg | INTRAMUSCULAR | Status: DC | PRN

## 2020-11-22 MED ORDER — CEFAZOLIN SODIUM-DEXTROSE 2-4 GM/100ML-% IV SOLN
INTRAVENOUS | Status: AC
  Administered 2020-11-22: 2 g via INTRAVENOUS
  Filled 2020-11-22: qty 100

## 2020-11-22 MED ORDER — APIXABAN 2.5 MG PO TABS
2.5000 mg | ORAL_TABLET | Freq: Two times a day (BID) | ORAL | Status: DC
  Administered 2020-11-23: 2.5 mg via ORAL
  Filled 2020-11-22: qty 1

## 2020-11-22 MED ORDER — BISACODYL 10 MG RE SUPP
10.0000 mg | Freq: Every day | RECTAL | Status: DC | PRN
  Administered 2020-11-23: 10 mg via RECTAL
  Filled 2020-11-22: qty 1

## 2020-11-22 MED ORDER — FAMOTIDINE 20 MG PO TABS
ORAL_TABLET | ORAL | Status: AC
  Administered 2020-11-22: 20 mg via ORAL
  Filled 2020-11-22: qty 1

## 2020-11-22 MED ORDER — CEFAZOLIN SODIUM-DEXTROSE 2-4 GM/100ML-% IV SOLN
2.0000 g | Freq: Four times a day (QID) | INTRAVENOUS | Status: AC
  Administered 2020-11-23: 2 g via INTRAVENOUS
  Filled 2020-11-22 (×2): qty 100

## 2020-11-22 MED ORDER — MIDAZOLAM HCL 5 MG/5ML IJ SOLN
INTRAMUSCULAR | Status: DC | PRN
  Administered 2020-11-22: 2 mg via INTRAVENOUS

## 2020-11-22 MED ORDER — FENTANYL CITRATE (PF) 100 MCG/2ML IJ SOLN: 25.0000 ug | INTRAMUSCULAR | Status: DC | PRN

## 2020-11-22 MED ORDER — FENTANYL CITRATE (PF) 100 MCG/2ML IJ SOLN
INTRAMUSCULAR | Status: DC | PRN
  Administered 2020-11-22: 50 ug via INTRAVENOUS
  Administered 2020-11-22 (×2): 25 ug via INTRAVENOUS

## 2020-11-22 MED ORDER — TRAMADOL HCL 50 MG PO TABS: 50.0000 mg | ORAL_TABLET | Freq: Four times a day (QID) | ORAL | Status: DC | PRN

## 2020-11-22 MED ORDER — ACETAMINOPHEN 500 MG PO TABS
1000.0000 mg | ORAL_TABLET | Freq: Four times a day (QID) | ORAL | Status: AC
  Administered 2020-11-22 – 2020-11-23 (×4): 1000 mg via ORAL
  Filled 2020-11-22 (×4): qty 2

## 2020-11-22 MED ORDER — ORAL CARE MOUTH RINSE: 15.0000 mL | Freq: Once | OROMUCOSAL | Status: AC

## 2020-11-22 MED ORDER — OXYCODONE HCL 5 MG/5ML PO SOLN: 5.0000 mg | Freq: Once | ORAL | Status: DC | PRN

## 2020-11-22 MED ORDER — METOCLOPRAMIDE HCL 5 MG/ML IJ SOLN: 5.0000 mg | Freq: Three times a day (TID) | INTRAMUSCULAR | Status: DC | PRN

## 2020-11-22 MED ORDER — SODIUM CHLORIDE FLUSH 0.9 % IV SOLN
INTRAVENOUS | Status: AC
  Filled 2020-11-22: qty 40

## 2020-11-22 SURGICAL SUPPLY — 64 items
APL PRP STRL LF DISP 70% ISPRP (MISCELLANEOUS) ×1
BIT DRILL QUICK REL 1/8 2PK SL (DRILL) ×1 IMPLANT
BLADE SAW SAG 25X90X1.19 (BLADE) ×2 IMPLANT
BLADE SURG SZ20 CARB STEEL (BLADE) ×2 IMPLANT
BNDG CMPR STD VLCR NS LF 5.8X6 (GAUZE/BANDAGES/DRESSINGS) ×1
BNDG ELASTIC 6X5.8 VLCR NS LF (GAUZE/BANDAGES/DRESSINGS) ×2 IMPLANT
BRNG TIB 71X10 ANT STAB MDLR (Insert) ×1 IMPLANT
CEMENT BONE R 1X40 (Cement) ×4 IMPLANT
CEMENT VACUUM MIXING SYSTEM (MISCELLANEOUS) ×2 IMPLANT
CHLORAPREP W/TINT 26 (MISCELLANEOUS) ×2 IMPLANT
COMP FEMORAL CRUC LEFT 67.5MM (Joint) ×2 IMPLANT
COMPONENT FEMRL CRUC LT 67.5MM (Joint) ×1 IMPLANT
COOLER POLAR GLACIER W/PUMP (MISCELLANEOUS) ×2 IMPLANT
COVER MAYO STAND REUSABLE (DRAPES) ×2 IMPLANT
CUFF TOURN SGL QUICK 24 (TOURNIQUET CUFF)
CUFF TOURN SGL QUICK 34 (TOURNIQUET CUFF) ×2
CUFF TRNQT CYL 24X4X16.5-23 (TOURNIQUET CUFF) IMPLANT
CUFF TRNQT CYL 34X4.125X (TOURNIQUET CUFF) IMPLANT
CUFF TRNQT CYL 34X4X40X1 (TOURNIQUET CUFF) ×1 IMPLANT
DRAPE 3/4 80X56 (DRAPES) ×4 IMPLANT
DRAPE IMP U-DRAPE 54X76 (DRAPES) ×2 IMPLANT
DRILL QUICK RELEASE 1/8 INCH (DRILL) ×1
DRSG MEPILEX SACRM 8.7X9.8 (GAUZE/BANDAGES/DRESSINGS) ×2 IMPLANT
DRSG OPSITE POSTOP 4X10 (GAUZE/BANDAGES/DRESSINGS) IMPLANT
DRSG OPSITE POSTOP 4X8 (GAUZE/BANDAGES/DRESSINGS) ×2 IMPLANT
ELECT REM PT RETURN 9FT ADLT (ELECTROSURGICAL) ×2
ELECTRODE REM PT RTRN 9FT ADLT (ELECTROSURGICAL) ×1 IMPLANT
GAUZE 4X4 16PLY ~~LOC~~+RFID DBL (SPONGE) ×2 IMPLANT
GAUZE XEROFORM 1X8 LF (GAUZE/BANDAGES/DRESSINGS) ×2 IMPLANT
GLOVE SRG 8 PF TXTR STRL LF DI (GLOVE) ×1 IMPLANT
GLOVE SURG ENC MOIS LTX SZ7.5 (GLOVE) ×8 IMPLANT
GLOVE SURG ENC MOIS LTX SZ8 (GLOVE) ×8 IMPLANT
GLOVE SURG UNDER LTX SZ8 (GLOVE) ×2 IMPLANT
GLOVE SURG UNDER POLY LF SZ8 (GLOVE) ×2
GOWN STRL REUS W/ TWL LRG LVL3 (GOWN DISPOSABLE) ×3 IMPLANT
GOWN STRL REUS W/ TWL XL LVL3 (GOWN DISPOSABLE) ×1 IMPLANT
GOWN STRL REUS W/TWL LRG LVL3 (GOWN DISPOSABLE) ×6
GOWN STRL REUS W/TWL XL LVL3 (GOWN DISPOSABLE) ×2
HOOD PEEL AWAY FLYTE STAYCOOL (MISCELLANEOUS) ×8 IMPLANT
INSERT TIB BEARING 71 10 (Insert) ×2 IMPLANT
IV NS IRRIG 3000ML ARTHROMATIC (IV SOLUTION) ×2 IMPLANT
KIT TURNOVER KIT A (KITS) ×2 IMPLANT
MANIFOLD NEPTUNE II (INSTRUMENTS) ×2 IMPLANT
NEEDLE SPNL 20GX3.5 QUINCKE YW (NEEDLE) ×2 IMPLANT
NS IRRIG 1000ML POUR BTL (IV SOLUTION) ×2 IMPLANT
PACK TOTAL KNEE (MISCELLANEOUS) ×2 IMPLANT
PAD WRAPON POLAR KNEE (MISCELLANEOUS) ×1 IMPLANT
PEG PATELLA SERIES A 37MMX10MM (Orthopedic Implant) ×2 IMPLANT
PENCIL SMOKE EVACUATOR (MISCELLANEOUS) ×2 IMPLANT
PLATE KNEE TIBIAL 71MM FIXED (Plate) ×2 IMPLANT
PULSAVAC PLUS IRRIG FAN TIP (DISPOSABLE) ×2
SPONGE T-LAP 18X18 ~~LOC~~+RFID (SPONGE) ×6 IMPLANT
STAPLER SKIN PROX 35W (STAPLE) ×2 IMPLANT
SUCTION FRAZIER HANDLE 10FR (MISCELLANEOUS) ×1
SUCTION TUBE FRAZIER 10FR DISP (MISCELLANEOUS) ×1 IMPLANT
SUT VIC AB 0 CT1 36 (SUTURE) ×6 IMPLANT
SUT VIC AB 2-0 CT1 27 (SUTURE) ×6
SUT VIC AB 2-0 CT1 TAPERPNT 27 (SUTURE) ×3 IMPLANT
SYR 10ML LL (SYRINGE) ×2 IMPLANT
SYR 20ML LL LF (SYRINGE) ×2 IMPLANT
SYR 30ML LL (SYRINGE) IMPLANT
TIP FAN IRRIG PULSAVAC PLUS (DISPOSABLE) ×1 IMPLANT
TRAP FLUID SMOKE EVACUATOR (MISCELLANEOUS) ×2 IMPLANT
WRAPON POLAR PAD KNEE (MISCELLANEOUS) ×2

## 2020-11-22 NOTE — Evaluation (Signed)
Physical Therapy Evaluation Patient Details Name: Lindsay Price MRN: [id number] DOB: [birth year] Today's Date: 11/22/2020   History of Present Illness  admitted for acute hospitalization status post L TKR, WBAT (11/22/20).  Clinical Impression  Patient resting in bed upon arrival to room; eager for OOB activities due to need for toileting.  Alert and oriented, follows commands; generally impulsive requiring frequent redirection with mobility efforts.  L knee pain 0-1/10 at rest; 4-5/10 with movement and WBing.  Fair post-op strength (3-/5) and ROM (10-85 degrees) noted with isolated therex; limited by pain with movement.  Currently requiring min assist for sit/stand, basic transfers and gait (50') with RW.  Demonstrates 3-point, step to gait pattern with decreased WBing L LE; slow and cautious, progressive increase in L LE loading as distance increases.  Distance limited by fatigue. Do anticipate good progress throughout rehab course. Would benefit from skilled PT to address above deficits and promote optimal return to PLOF.; Recommend transition to HHPT upon discharge from acute hospitalization.     Follow Up Recommendations Home health PT    Equipment Recommendations  Rolling walker with 5" wheels;3in1 (PT)    Recommendations for Other Services       Precautions / Restrictions Precautions Precautions: Fall Restrictions Weight Bearing Restrictions: Yes LLE Weight Bearing: Weight bearing as tolerated      Mobility  Bed Mobility Overal bed mobility: Modified Independent             General bed mobility comments: self-assists L LE out of bed as needed    Transfers Overall transfer level: Needs assistance Equipment used: Rolling walker (2 wheeled) Transfers: Sit to/from Stand Sit to Stand: Min guard;Min assist         General transfer comment: cuing for hand placement; very heavy reliance on bilat UEs (excessive weight shift to R LE)  Ambulation/Gait Ambulation/Gait  assistance: Min guard;Min assist Gait Distance (Feet): 50 Feet Assistive device: Rolling walker (2 wheeled)       General Gait Details: 3-point, step to gait pattern with decreased WBing L LE; slow and cautious, progressive increase in L LE loading as distance increases.  Distance limited by fatigue.  Stairs            Wheelchair Mobility    Modified Rankin (Stroke Patients Only)       Balance Overall balance assessment: Needs assistance Sitting-balance support: No upper extremity supported;Feet supported Sitting balance-Leahy Scale: Good     Standing balance support: Bilateral upper extremity supported Standing balance-Leahy Scale: Fair                               Pertinent Vitals/Pain Pain Assessment: Faces Faces Pain Scale: Hurts little more Pain Location: L knee Pain Descriptors / Indicators: Aching;Guarding;Grimacing Pain Intervention(s): Limited activity within patient's tolerance;Monitored during session;Repositioned    Home Living Family/patient expects to be discharged to:: Private residence Living Arrangements: Spouse/significant other;Children Available Help at Discharge: Family Type of Home: House Home Access: Stairs to enter Entrance Stairs-Rails: Right Entrance Stairs-Number of Steps: 4 Home Layout: One level Home Equipment: None      Prior Function Level of Independence: Independent         Comments: Indep with ADLs, household and limited community mobilization without assist device; working at [employer]        Extremity/Trunk Assessment   Upper Extremity Assessment Upper Extremity Assessment: Overall WFL for tasks assessed  Lower Extremity Assessment Lower Extremity Assessment:  (R LE grossly 3-/5, limited by pain.  Full sensation intact.  L LE grossly WFL)       Communication   Communication: No difficulties  Cognition Arousal/Alertness: Awake/alert Behavior During Therapy: WFL for  tasks assessed/performed Overall Cognitive Status: Within Functional Limits for tasks assessed                                        General Comments      Exercises Total Joint Exercises Goniometric ROM: L LE, seated: 10-85 degrees Other Exercises Other Exercises: Seated LE therex, 1x10, active ROM: ankle pumps, LAQs, marching.  Fair isolated strength and activation with seated therex; generally limited by pain. Other Exercises: Toilet transfer, SPT with RW, min assist; cuing for hand placement and safety with all transfers.  Impulsive with movement transitions. Other Exercises: Educated in L LE WBing restrictions, progressive mobility and course of rehab; patient voiced understanding of all information.   Assessment/Plan    PT Assessment Patient needs continued PT services  PT Problem List Decreased strength;Decreased range of motion;Decreased activity tolerance;Decreased balance;Decreased mobility;Decreased cognition;Decreased knowledge of use of DME;Decreased safety awareness;Decreased knowledge of precautions;Pain       PT Treatment Interventions DME instruction;Gait training;Stair training;Functional mobility training;Therapeutic activities;Therapeutic exercise;Balance training;Patient/family education    PT Goals (Current goals can be found in the Care Plan section)  Acute Rehab PT Goals Patient Stated Goal: to return home PT Goal Formulation: With patient Time For Goal Achievement: 12/06/20 Potential to Achieve Goals: Good    Frequency BID   Barriers to discharge        Co-evaluation               AM-PAC PT "6 Clicks" Mobility  Outcome Measure Help needed turning from your back to your side while in a flat bed without using bedrails?: None Help needed moving from lying on your back to sitting on the side of a flat bed without using bedrails?: None Help needed moving to and from a bed to a chair (including a wheelchair)?: A Little Help needed  standing up from a chair using your arms (e.g., wheelchair or bedside chair)?: A Little Help needed to walk in hospital room?: A Little Help needed climbing 3-5 steps with a railing? : A Little 6 Click Score: 20    End of Session Equipment Utilized During Treatment: Gait belt Activity Tolerance: Patient tolerated treatment well Patient left: in chair;with call bell/phone within reach;with chair alarm set Nurse Communication: Mobility status PT Visit Diagnosis: Muscle weakness (generalized) (M62.81);Difficulty in walking, not elsewhere classified (R26.2);Pain Pain - Right/Left: Left Pain - part of body: Knee    Time: 5993-5701 PT Time Calculation (min) (ACUTE ONLY): 32 min   Charges:   PT Evaluation $PT Eval Moderate Complexity: 1 Mod PT Treatments $Therapeutic Exercise: 8-22 mins $Therapeutic Activity: 8-22 mins        Dametria Tuzzolino H. Manson Passey, PT, DPT, NCS 11/22/20, 4:14 PM [phone]

## 2020-11-22 NOTE — Anesthesia Preprocedure Evaluation (Addendum)
Anesthesia Evaluation  Patient identified by MRN, date of birth, ID band  Reviewed: Allergy & Precautions, NPO status , Patient's Chart, lab work & pertinent test results  Airway Mallampati: II  TM Distance: >3 FB Neck ROM: Full    Dental  (+) Chipped   Pulmonary sleep apnea , Current Smoker and Patient abstained from smoking.,    + rhonchi        Cardiovascular Exercise Tolerance: Good hypertension, Pt. on medications Normal cardiovascular exam     Neuro/Psych Alcohol Abusenegative neurological ROS     GI/Hepatic Neg liver ROS,   Endo/Other  negative endocrine ROS  Renal/GU negative Renal ROS     Musculoskeletal  (+) Arthritis ,   Abdominal Normal abdominal exam  (+)   Peds  Hematology Lupus   Anesthesia Other Findings   Reproductive/Obstetrics negative OB ROS                            Anesthesia Physical Anesthesia Plan  ASA: 3  Anesthesia Plan: General/Spinal   Post-op Pain Management:    Induction:   PONV Risk Score and Plan: 2 and Propofol infusion and TIVA  Airway Management Planned: Natural Airway and Nasal Cannula  Additional Equipment:   Intra-op Plan:   Post-operative Plan:   Informed Consent: I have reviewed the patients History and Physical, chart, labs and discussed the procedure including the risks, benefits and alternatives for the proposed anesthesia with the patient or authorized representative who has indicated his/her understanding and acceptance.     Dental advisory given  Plan Discussed with:   Anesthesia Plan Comments:         Anesthesia Quick Evaluation

## 2020-11-22 NOTE — Plan of Care (Signed)
  Problem: Education: Goal: Knowledge of General Education information will improve Description: Including pain rating scale, medication(s)/side effects and non-pharmacologic comfort measures Outcome: Progressing   Problem: Health Behavior/Discharge Planning: Goal: Ability to manage health-related needs will improve Outcome: Progressing   Problem: Clinical Measurements: Goal: Ability to maintain clinical measurements within normal limits will improve Outcome: Progressing Goal: Will remain free from infection Outcome: Progressing Goal: Diagnostic test results will improve Outcome: Progressing Goal: Respiratory complications will improve Outcome: Progressing Goal: Cardiovascular complication will be avoided Outcome: Progressing   Problem: Activity: Goal: Risk for activity intolerance will decrease Outcome: Progressing   Problem: Nutrition: Goal: Adequate nutrition will be maintained Outcome: Progressing   Problem: Coping: Goal: Level of anxiety will decrease Outcome: Progressing   Problem: Elimination: Goal: Will not experience complications related to bowel motility Outcome: Progressing Goal: Will not experience complications related to urinary retention Outcome: Progressing   Problem: Pain Managment: Goal: General experience of comfort will improve Outcome: Progressing   Problem: Safety: Goal: Ability to remain free from injury will improve Outcome: Progressing   Problem: Skin Integrity: Goal: Risk for impaired skin integrity will decrease Outcome: Progressing   Problem: Education: Goal: Knowledge of the prescribed therapeutic regimen will improve Outcome: Progressing Goal: Individualized Educational Video(s) Outcome: Progressing   Problem: Activity: Goal: Ability to avoid complications of mobility impairment will improve Outcome: Progressing Goal: Range of joint motion will improve Outcome: Progressing   Problem: Clinical Measurements: Goal:  Postoperative complications will be avoided or minimized Outcome: Progressing   Problem: Pain Management: Goal: Pain level will decrease with appropriate interventions Outcome: Progressing   
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
## 2020-11-22 NOTE — Anesthesia Postprocedure Evaluation (Signed)
Anesthesia Post Note  Patient: Lindsay Price  Procedure(s) Performed: TOTAL KNEE ARTHROPLASTY (Left: Knee)  Patient location during evaluation: PACU Anesthesia Type: Combined General/Spinal Level of consciousness: awake and alert Pain management: pain level controlled Vital Signs Assessment: post-procedure vital signs reviewed and stable Respiratory status: spontaneous breathing, nonlabored ventilation, respiratory function stable and patient connected to nasal cannula oxygen Cardiovascular status: blood pressure returned to baseline and stable Postop Assessment: no apparent nausea or vomiting Anesthetic complications: no   No notable events documented.   Last Vitals:  Vitals:   11/22/20 1200 11/22/20 1230  BP: (!) 154/99 (!) 143/106  Pulse: 66 75  Resp: 18 16  Temp: (!) 36.4 C 36.8 C  SpO2: 99% 100%    Last Pain:  Vitals:   11/22/20 1200  TempSrc:   PainSc: 0-No pain                 Foye Deer

## 2020-11-22 NOTE — Progress Notes (Signed)
Patient awake/alert x4. Able to move bil. Lower ext, able to flex knee's, lift hips, wiggle toes bil. Patient denies pain.  Left knee dressing c/d/I with polar care in place. Pulses intact, ext warm +CMS bil lower ext.

## 2020-11-22 NOTE — Op Note (Signed)
11/22/2020  10:26 AM  Patient:   Lindsay Price  Pre-Op Diagnosis:   Degenerative joint disease, left knee.  Post-Op Diagnosis:   Same  Procedure:   Left TKA using all-cemented [employer] Vanguard system with a 67.5 mm mm PCR femur, a 71 mm tibial tray with a 10 mm anterior stabilized E-poly insert, and a 37 x 10 mm all-poly 3-pegged domed patella.  Surgeon:   Maryagnes Amos, MD  Assistant:   Horris Latino, PA-C; Amedeo Plenty, PA-S  Anesthesia:   Spinal  Findings:   As above  Complications:   None  EBL:   10 cc  Fluids:   1000 cc crystalloid  UOP:   None  TT:   100 minutes at 300 mmHg  Drains:   None  Closure:   Staples  Implants:   As above  Brief Clinical Note:   The patient is a 50 year old female with a long history of progressively worsening left knee pain. The patient's symptoms have progressed despite medications, activity modification, injections, etc. The patient's history and examination were consistent with advanced degenerative joint disease of the left knee confirmed by plain radiographs. The patient presents at this time for a left total knee arthroplasty.  Procedure:   The patient was brought into the operating room. After adequate spinal anesthesia was obtained, the patient was lain in the supine position before the left lower extremity was prepped with ChloraPrep solution and draped sterilely. Preoperative antibiotics were administered. After verifying the proper laterality with a surgical timeout, the limb was exsanguinated with an Esmarch and the tourniquet inflated to 300 mmHg.   A standard anterior approach to the knee was made through an approximately 6-7 inch incision. The incision was carried down through the subcutaneous tissues to expose superficial retinaculum. This was split the length of the incision and the medial flap elevated sufficiently to expose the medial retinaculum. The medial retinaculum was incised, leaving a 3-4 mm cuff of tissue on the  patella. This was extended distally along the medial border of the patellar tendon and proximally through the medial third of the quadriceps tendon. A subtotal fat pad excision was performed before the soft tissues were elevated off the anteromedial and anterolateral aspects of the proximal tibia to the level of the collateral ligaments. The anterior portions of the medial and lateral menisci were removed, as was the anterior cruciate ligament. With the knee flexed to 90, the external tibial guide was positioned and the appropriate proximal tibial cut made. This piece was taken to the back table where it was measured and found to be optimally replicated by a 71 mm component.  Attention was directed to the distal femur. The intramedullary canal was accessed through a 3/8" drill hole. The intramedullary guide was inserted and positioned in order to obtain a neutral flexion gap. The intercondylar block was positioned with care taken to avoid notching the anterior cortex of the femur. The appropriate cut was made. Next, the distal cutting block was placed at 5 of valgus alignment. Using the 9 mm slot, the distal cut was made. The distal femur was measured and found to be optimally replicated by the 67.5 mm component. The 67.5 mm 4-in-1 cutting block was positioned and first the posterior, then the posterior chamfer, the anterior chamfer, and finally the anterior cuts were made. At this point, the posterior portions medial and lateral menisci were removed. A trial reduction was performed using the appropriate femoral and tibial components with the 10 mm insert.  This demonstrated excellent stability to varus and valgus stressing both in flexion and extension while permitting full extension. Patella tracking was assessed and found to be excellent. Therefore, the tibial guide position was marked on the proximal tibia. The patella thickness was measured and found to be 25 mm. Therefore, the appropriate cut was made. The  patellar surface was measured and found to be optimally replicated by the 37 mm component. The three peg holes were drilled in place before the trial button was inserted. Patella tracking was assessed and found to be excellent, passing the "no thumb test". The lug holes were drilled into the distal femur before the trial component was removed, leaving only the tibial tray. The keel was then created using the appropriate tower, reamer, and punch.  The bony surfaces were prepared for cementing by irrigating them thoroughly with sterile saline solution via the jet lavage system. A bone plug was fashioned from some of the bone that had been removed previously and used to plug the distal femoral canal. In addition, 20 cc of Exparel diluted out to 60 cc with normal saline and 30 cc of 0.5% Sensorcaine were injected into the postero-medial and postero-lateral aspects of the knee, the medial and lateral gutter regions, and the peri-incisional tissues to help with postoperative analgesia. Meanwhile, the cement was being mixed on the back table. When it was ready, the tibial tray was cemented in first. The excess cement was removed using Personal assistant. Next, the femoral component was impacted into place. Again, the excess cement was removed using Personal assistant. The 10 mm trial insert was positioned and the knee brought into extension while the cement hardened. Finally, the patella was cemented into place and secured using the patellar clamp. Again, the excess cement was removed using Personal assistant. Once the cement had hardened, the knee was placed through a range of motion with the findings as described above. Therefore, the trial insert was removed and, after verifying that no cement had been retained posteriorly, the permanent 10 mm anterior stabilized E-polyethylene insert was positioned and secured using the appropriate key locking mechanism. Again the knee was placed through a range of motion with the findings as  described above.  The wound was copiously irrigated with sterile saline solution using the jet lavage system before the quadriceps tendon and retinacular layer were reapproximated using #0 Vicryl interrupted sutures. The superficial retinacular layer also was closed using a running #0 Vicryl suture. A total of 10 cc of transexemic acid (TXA) was injected intra-articularly before the subcutaneous tissues were closed in several layers using 2-0 Vicryl interrupted sutures. The skin was closed using staples. A sterile honeycomb dressing was applied to the skin before the leg was wrapped with an Ace wrap to accommodate the Polar Care device. The patient was then awakened and returned to the recovery room in satisfactory condition after tolerating the procedure well.

## 2020-11-22 NOTE — Transfer of Care (Signed)
Immediate Anesthesia Transfer of Care Note  Patient: Lindsay Price  Procedure(s) Performed: TOTAL KNEE ARTHROPLASTY (Left: Knee)  Patient Location: PACU  Anesthesia Type:Spinal  Level of Consciousness: awake, alert  and oriented  Airway & Oxygen Therapy: Patient Spontanous Breathing and Patient connected to face mask oxygen  Post-op Assessment: Report given to RN and Post -op Vital signs reviewed and stable  Post vital signs: Reviewed and stable  Last Vitals:  Vitals Value Taken Time  BP    Temp    Pulse    Resp 18 11/22/20 1010  SpO2    Vitals shown include unvalidated device data.  Last Pain:  Vitals:   11/22/20 0626  TempSrc: Temporal  PainSc: 0-No pain         Complications: No notable events documented.

## 2020-11-22 NOTE — Anesthesia Procedure Notes (Addendum)
Spinal  Patient location during procedure: OR Start time: 11/22/2020 7:35 AM End time: 11/22/2020 7:40 AM Reason for block: surgical anesthesia Staffing Performed: resident/CRNA  Resident/CRNA: Nelda Marseille, CRNA Preanesthetic Checklist Completed: patient identified, IV checked, site marked, risks and benefits discussed, surgical consent, monitors and equipment checked, pre-op evaluation and timeout performed Spinal Block Patient position: sitting Prep: Betadine Patient monitoring: heart rate, continuous pulse ox, blood pressure and cardiac monitor Approach: midline Location: L3-4 Injection technique: single-shot Needle Needle type: Whitacre and Introducer  Needle gauge: 25 G Needle length: 9 cm Assessment Sensory level: T10 Events: CSF return Additional Notes Negative paresthesia. Negative blood return. Positive free-flowing CSF. Expiration date of kit checked and confirmed. Patient tolerated procedure well, without complications.

## 2020-11-23 LAB — BASIC METABOLIC PANEL
Anion gap: 6 (ref 5–15)
BUN: 9 mg/dL (ref 6–20)
CO2: 28 mmol/L (ref 22–32)
Calcium: 8.3 mg/dL — ABNORMAL LOW (ref 8.9–10.3)
Chloride: 98 mmol/L (ref 98–111)
Creatinine, Ser: 0.66 mg/dL (ref 0.44–1.00)
GFR, Estimated: >60 mL/min (ref >60)
Glucose, Bld: 107 mg/dL — ABNORMAL HIGH (ref 70–99)
Potassium: 3.5 mmol/L (ref 3.5–5.1)
Sodium: 132 mmol/L — ABNORMAL LOW (ref 135–145)

## 2020-11-23 LAB — CBC
HCT: 30.3 % — ABNORMAL LOW (ref 36.0–46.0)
Hemoglobin: 10.2 g/dL — ABNORMAL LOW (ref 12.0–15.0)
MCH: 36.4 pg — ABNORMAL HIGH (ref 26.0–34.0)
MCHC: 33.7 g/dL (ref 30.0–36.0)
MCV: 108.2 fL — ABNORMAL HIGH (ref 80.0–100.0)
Platelets: 141 10*3/uL — ABNORMAL LOW (ref 150–400)
RBC: 2.8 MIL/uL — ABNORMAL LOW (ref 3.87–5.11)
RDW: 12.6 % (ref 11.5–15.5)
WBC: 4.2 10*3/uL (ref 4.0–10.5)
nRBC: 0 % (ref 0.0–0.2)

## 2020-11-23 MED ORDER — TRAMADOL HCL 50 MG PO TABS: 50.0000 mg | ORAL_TABLET | Freq: Four times a day (QID) | ORAL | 0 refills | Status: DC | PRN

## 2020-11-23 MED ORDER — OXYCODONE HCL 5 MG PO TABS: 5.0000 mg | ORAL_TABLET | ORAL | 0 refills | Status: DC | PRN

## 2020-11-23 MED ORDER — ONDANSETRON HCL 4 MG PO TABS: 4.0000 mg | ORAL_TABLET | Freq: Four times a day (QID) | ORAL | 0 refills | Status: DC | PRN

## 2020-11-23 MED ORDER — NICOTINE 14 MG/24HR TD PT24
14.0000 mg | MEDICATED_PATCH | Freq: Every day | TRANSDERMAL | Status: DC
  Administered 2020-11-23: 14 mg via TRANSDERMAL
  Filled 2020-11-23: qty 1

## 2020-11-23 MED ORDER — APIXABAN 2.5 MG PO TABS: 2.5000 mg | ORAL_TABLET | Freq: Two times a day (BID) | ORAL | 0 refills | Status: AC

## 2020-11-23 NOTE — Plan of Care (Signed)
Patient discharged home per MD orders at this time.All discharge instructions, medications and education reviewed with patient at bedside. Patient expressed understanding and will comply with dc instructions.follow up appointments also communicated to patient.no verbal c/o or any ssx of distress.patient discharged home with HH/PT services per order.Pt transported home by daughter in a private car.

## 2020-11-23 NOTE — Progress Notes (Signed)
  Subjective: 1 Day Post-Op Procedure(s) (LRB): TOTAL KNEE ARTHROPLASTY (Left) Patient reports pain as mild.   Patient is well, and has had no acute complaints or problems Plan is to go Home after hospital stay. Negative for chest pain and shortness of breath Fever: no Gastrointestinal:Negative for nausea and vomiting Patient states that she is passing gas without pain.  Objective: Vital signs in last 24 hours: Temp:  [97.4 F (36.3 C)-98.6 F (37 C)] 98.6 F (37 C) (07/20 0327) Pulse Rate:  [60-94] 93 (07/20 0327) Resp:  [16-19] 18 (07/20 0327) BP: (120-164)/(93-107) 134/96 (07/20 0327) SpO2:  [96 %-100 %] 97 % (07/20 0327)  Intake/Output from previous day:  Intake/Output Summary (Last 24 hours) at 11/23/2020 0737 Last data filed at 11/23/2020 0333 Gross per 24 hour  Intake 2405 ml  Output 810 ml  Net 1595 ml    Intake/Output this shift: No intake/output data recorded.  Labs: Recent Labs    11/23/20 0425  HGB 10.2*   Recent Labs    11/23/20 0425  WBC 4.2  RBC 2.80*  HCT 30.3*  PLT 141*   Recent Labs    11/23/20 0425  NA 132*  K 3.5  CL 98  CO2 28  BUN 9  CREATININE 0.66  GLUCOSE 107*  CALCIUM 8.3*   No results for input(s): LABPT, INR in the last 72 hours.   EXAM General - Patient is Alert, Appropriate, and Oriented Extremity - ABD soft Sensation intact distally Intact pulses distally Dorsiflexion/Plantar flexion intact Incision: scant drainage No cellulitis present Dressing/Incision - bloody drainage noted to the most distal aspect of the incision. Motor Function - intact, moving foot and toes well on exam.  Abdomen soft with normal bowel sounds.  Past Medical History:  Diagnosis Date   Alcoholism (HCC)    Arthritis    Hypertension    Lupus (HCC)     Assessment/Plan: 1 Day Post-Op Procedure(s) (LRB): TOTAL KNEE ARTHROPLASTY (Left) Active Problems:   Status post total knee replacement using cement, left  Estimated body mass index  is 25.52 kg/m as calculated from the following:   Height as of this encounter: 5\' 11"  (1.803 m).   Weight as of this encounter: 83 kg. Advance diet Up with therapy D/C IV fluids when tolerating po intake.  Labs reviewed, WBC 4.2, Hg 10.2. Up with therapy today, walked 50 feet yesterday. Patient is passing gas, working on BM. Nicotine patch ordered for the patient. Plan for possible d/c home today pending progress with PT.  DVT Prophylaxis - Foot Pumps, TED hose, and Eliquis Weight-Bearing as tolerated to left leg  J. , PA-C [facility] 11/23/2020, 7:37 AM

## 2020-11-23 NOTE — Discharge Instructions (Signed)

## 2020-11-23 NOTE — TOC Progression Note (Signed)
Transition of Care [facility]) - Progression Note    Patient Details  Name: Lindsay Price MRN: [id number] Date of Birth: 15-Jul-1970  [facility] CM/SW Contact  Caryn Section, RN Phone Number: 11/23/2020, 4:00 PM  Clinical Narrative:   Patient has discharge orders.  Has home health set up with [facility] prior to admission.  [facility] confirms this.  Patient will be staying with daughter at discharge, no concerns about discharge.  Patient states she has no concerns transporting to appointments or getting medications.    DME rolling walker and 3 n 1 to be delivered to patient's room by [employer].  Patient denies further concerns or TOC needs at this time.         Expected Discharge Plan and Services           Expected Discharge Date: 11/23/20                                     Social Determinants of Health (SDOH) Interventions    Readmission Risk Interventions No flowsheet data found.

## 2020-11-23 NOTE — Progress Notes (Signed)
Physical Therapy Treatment Patient Details Name: Lindsay Price MRN: [id number] DOB: [birth year] Today's Date: 11/23/2020    History of Present Illness admitted for acute hospitalization status post L TKR, WBAT (11/22/20).    PT Comments    Pt is making good progress and has met PT goals. Stair training completed and reviewed written HEP. Pt motivated to participate and hopeful for dc home this date. Relayed progress to care team via secure chat. Good AAROM this date.   Follow Up Recommendations  Home health PT     Equipment Recommendations  Rolling walker with 5" wheels;3in1 (PT)    Recommendations for Other Services       Precautions / Restrictions Precautions Precautions: Fall;Knee Precaution Booklet Issued: Yes (comment) Restrictions Weight Bearing Restrictions: Yes LLE Weight Bearing: Weight bearing as tolerated    Mobility  Bed Mobility Overal bed mobility: Needs Assistance Bed Mobility: Supine to Sit     Supine to sit: Min assist     General bed mobility comments: needs L LE assist for bed mobility. Once seated at EOB, upright posture noted. No dizziness    Transfers Overall transfer level: Needs assistance Equipment used: Rolling walker (2 wheeled) Transfers: Sit to/from Stand Sit to Stand: Min guard         General transfer comment: pushing from seated surface. RW used  Ambulation/Gait Ambulation/Gait assistance: Counsellor (Feet): 250 Feet Assistive device: Rolling walker (2 wheeled) Gait Pattern/deviations: Step-through pattern     General Gait Details: inconsistent reciprocal gait pattern with RW. Heavy use of B UE on RW with cues for looking forward vs down at floor. Required to pause several times for standing rest break.   Stairs Stairs: Yes Stairs assistance: Min guard Stair Management: One rail Right;Forwards Number of Stairs: 4 General stair comments: demonstrated prior to performance. Up/down with safe technique.  Follows commands well   Wheelchair Mobility    Modified Rankin (Stroke Patients Only)       Balance Overall balance assessment: Needs assistance Sitting-balance support: No upper extremity supported;Feet supported Sitting balance-Leahy Scale: Good     Standing balance support: Bilateral upper extremity supported Standing balance-Leahy Scale: Fair                              Cognition Arousal/Alertness: Awake/alert Behavior During Therapy: WFL for tasks assessed/performed Overall Cognitive Status: Within Functional Limits for tasks assessed                                        Exercises Total Joint Exercises Goniometric ROM: L LE AAROM: 5-80 degrees Other Exercises Other Exercises: supine ther-ex performed x 12 reps with L LE including AP, quad sets, SLRs, hip abd/add. Written HEP given and reviewed. Other Exercises: ambulated to toilet with RW. Cues for hand placement. Able to perform hygiene with supervision    General Comments        Pertinent Vitals/Pain Pain Assessment: 0-10 Pain Score: 7  Pain Location: L knee Pain Descriptors / Indicators: Aching;Guarding;Grimacing Pain Intervention(s): Limited activity within patient's tolerance;Premedicated before session;Repositioned;Ice applied    Home Living                      Prior Function            PT Goals (current goals can now be found in  the care plan section) Acute Rehab PT Goals Patient Stated Goal: to return home PT Goal Formulation: With patient Time For Goal Achievement: 12/06/20 Potential to Achieve Goals: Good Progress towards PT goals: Progressing toward goals    Frequency    BID      PT Plan Current plan remains appropriate    Co-evaluation              AM-PAC PT "6 Clicks" Mobility   Outcome Measure  Help needed turning from your back to your side while in a flat bed without using bedrails?: None Help needed moving from lying on your  back to sitting on the side of a flat bed without using bedrails?: A Little   Help needed standing up from a chair using your arms (e.g., wheelchair or bedside chair)?: A Little Help needed to walk in hospital room?: A Little Help needed climbing 3-5 steps with a railing? : A Little 6 Click Score: 16    End of Session Equipment Utilized During Treatment: Gait belt Activity Tolerance: Patient tolerated treatment well Patient left: in bed;with bed alarm set Nurse Communication: Mobility status PT Visit Diagnosis: Muscle weakness (generalized) (M62.81);Difficulty in walking, not elsewhere classified (R26.2);Pain Pain - Right/Left: Left Pain - part of body: Knee     Time: 2763-9432 PT Time Calculation (min) (ACUTE ONLY): 33 min  Charges:  $Gait Training: 8-22 mins $Therapeutic Exercise: 8-22 mins                     Greggory Stallion, PT, DPT [phone]    Kordae Buonocore 11/23/2020, 11:41 AM

## 2020-11-23 NOTE — Discharge Summary (Signed)
Physician Discharge Summary  Patient ID: Lindsay Price MRN: [id number] DOB/AGE: 1970/11/28 50 y.o.  Admit date: 11/22/2020 Discharge date: 11/23/2020  Admission Diagnoses:  Status post total knee replacement using cement, left [Z96.652]  Discharge Diagnoses: 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   Fall    Rib fracture 05/04/2020   Amitriptyline overdose of undetermined intent 11/13/2017   Alcohol abuse 11/13/2017   Substance induced mood disorder (HCC) 11/13/2017    Past Medical History:  Diagnosis Date   Alcoholism (HCC)    Arthritis    Hypertension    Lupus (HCC)    Transfusion: None.   Consultants (if any):   Discharged Condition: Improved  Hospital Course: Lindsay Price is an 50 y.o. female who was admitted 11/22/2020 with a diagnosis of degenerative joint disease of the left knee and went to the operating room on 11/22/2020 and underwent the above named procedures.    Surgeries: Procedure(s): TOTAL KNEE ARTHROPLASTY on 11/22/2020 Patient tolerated the surgery well. Taken to PACU where she was stabilized and then transferred to the orthopedic floor.  Started on Eliquis 2.5mg  twice daily.  Foot pumps applied bilaterally at 80 mm. Heels elevated on bed with rolled towels. No evidence of DVT. Negative Homan. Physical therapy started on day #1 for gait training and transfer. OT started day #1 for ADL and assisted devices.  Patient's IV was removed on POD1.  Implants:  Left TKA using all-cemented [employer] Vanguard system with a 67.5 mm mm PCR femur, a 71 mm tibial tray with a 10 mm anterior stabilized E-poly insert, and a 37 x 10 mm all-poly 3-pegged domed patella.  She was given perioperative antibiotics:  Anti-infectives (From admission, onward)    Start     Dose/Rate Route Frequency Ordered Stop   11/22/20 1400  ceFAZolin (ANCEF) IVPB 2g/100 mL premix        2 g 200 mL/hr over 30  Minutes Intravenous Every 6 hours 11/22/20 1241 11/23/20 0759   11/22/20 0611  ceFAZolin (ANCEF) 2-4 GM/100ML-% IVPB       Note to Pharmacy: Brain Hilts   : cabinet override      11/22/20 0611 11/22/20 2214   11/22/20 0600  ceFAZolin (ANCEF) IVPB 2g/100 mL premix        2 g 200 mL/hr over 30 Minutes Intravenous On call to O.R. 11/21/20 2247 11/22/20 9622     .  She was given sequential compression devices, early ambulation, and Lovenox for DVT prophylaxis.  She benefited maximally from the hospital stay and there were no complications.    Recent vital signs:  Vitals:   11/23/20 0744 11/23/20 1121  BP: (!) 131/100 (!) 130/95  Pulse: 79 84  Resp: 18 18  Temp: 98.5 F (36.9 C) 98.8 F (37.1 C)  SpO2: 100% 96%    Recent laboratory studies:  Lab Results  Component Value Date   HGB 10.2 (L) 11/23/2020   HGB 13.5 10/19/2020   HGB 13.1 05/05/2020   Lab Results  Component Value Date   WBC 4.2 11/23/2020   PLT 141 (L) 11/23/2020   No results found for: INR Lab Results  Component Value Date   NA 132 (L) 11/23/2020   K 3.5 11/23/2020   CL 98 11/23/2020   CO2 28 11/23/2020   BUN 9 11/23/2020   CREATININE 0.66 11/23/2020   GLUCOSE 107 (H) 11/23/2020    Discharge Medications:   Allergies  as of 11/23/2020   No Known Allergies      Medication List     STOP taking these medications    meloxicam 15 MG tablet Commonly known as: MOBIC   naproxen 500 MG tablet Commonly known as: NAPROSYN       TAKE these medications    apixaban 2.5 MG Tabs tablet Commonly known as: ELIQUIS Take 1 tablet (2.5 mg total) by mouth 2 (two) times daily.   Blue-Emu Super Strength Crea Apply 1 application topically as needed. PAIN NO MORE THAN 3 DOSES PER 24 HOURS   ICY HOT EX Apply 1 application topically daily as needed (pain).   ondansetron 4 MG tablet Commonly known as: ZOFRAN Take 1 tablet (4 mg total) by mouth every 6 (six) hours as needed for nausea.   oxyCODONE 5 MG  immediate release tablet Commonly known as: Oxy IR/ROXICODONE Take 1-2 tablets (5-10 mg total) by mouth every 4 (four) hours as needed for moderate pain (pain score 4-6).   tiZANidine 4 MG tablet Commonly known as: Zanaflex Take 1 tablet (4 mg total) by mouth every 8 (eight) hours as needed for muscle spasms.   traMADol 50 MG tablet Commonly known as: ULTRAM Take 1 tablet (50 mg total) by mouth every 6 (six) hours as needed for moderate pain.               Durable Medical Equipment  (From admission, onward)           Start     Ordered   11/22/20 1242  DME Bedside commode  Once       Question:  Patient needs a bedside commode to treat with the following condition  Answer:  Status post total knee replacement using cement, left   11/22/20 1241   11/22/20 1242  DME 3 n 1  Once        11/22/20 1241   11/22/20 1242  DME Walker rolling  Once       Question Answer Comment  Walker: With 5 Inch Wheels   Patient needs a walker to treat with the following condition Status post total knee replacement using cement, left      11/22/20 1241           Diagnostic Studies: DG Knee Left Port  Result Date: 11/22/2020 CLINICAL DATA:  Post total knee replacement LEFT EXAM: PORTABLE LEFT KNEE - 1-2 VIEW COMPARISON:  Portable exam 1019 hours compared to 09/19/2020 FINDINGS: Components of LEFT knee prosthesis identified in expected position. No fracture, dislocation, or bone destruction. Mild osseous demineralization. Anterior soft tissue swelling and skin clips. IMPRESSION: LEFT knee prosthesis without acute complication. Electronically Signed   By: Ulyses Southward M.D.   On: 11/22/2020 12:14    Disposition: Plan for discharge home today pending afternoon session of PT.   Follow-up Information     Anson Oregon, PA-C Follow up in 14 day(s).   Specialty: Physician Assistant Why: Mindi Slicker information: [address] [place]  [address] [phone]                Signed: Meriel Pica PA-C 11/23/2020, 11:28 AM

## 2020-11-26 ENCOUNTER — Emergency Department: Payer: Medicaid Other

## 2020-11-26 ENCOUNTER — Other Ambulatory Visit: Payer: Self-pay

## 2020-11-26 ENCOUNTER — Encounter: Payer: Self-pay | Admitting: Emergency Medicine

## 2020-11-26 ENCOUNTER — Emergency Department
Admission: EM | Admit: 2020-11-26 | Discharge: 2020-11-26 | Disposition: A | Discharge status: Home / Self Care | Payer: Medicaid Other | Attending: Emergency Medicine | Admitting: Emergency Medicine

## 2020-11-26 DIAGNOSIS — Z96652 Presence of left artificial knee joint: Secondary | ICD-10-CM | POA: Diagnosis not present

## 2020-11-26 DIAGNOSIS — G8918 Other acute postprocedural pain: Secondary | ICD-10-CM | POA: Diagnosis not present

## 2020-11-26 DIAGNOSIS — Z5321 Procedure and treatment not carried out due to patient leaving prior to being seen by health care provider: Secondary | ICD-10-CM | POA: Diagnosis not present

## 2020-11-26 DIAGNOSIS — M7989 Other specified soft tissue disorders: Secondary | ICD-10-CM | POA: Diagnosis present

## 2020-11-26 LAB — COMPREHENSIVE METABOLIC PANEL
ALT: 13 U/L (ref 0–44)
AST: 20 U/L (ref 15–41)
Albumin: 3.3 g/dL — ABNORMAL LOW (ref 3.5–5.0)
Alkaline Phosphatase: 71 U/L (ref 38–126)
Anion gap: 7 (ref 5–15)
BUN: 6 mg/dL (ref 6–20)
CO2: 27 mmol/L (ref 22–32)
Calcium: 8.7 mg/dL — ABNORMAL LOW (ref 8.9–10.3)
Chloride: 98 mmol/L (ref 98–111)
Creatinine, Ser: 0.7 mg/dL (ref 0.44–1.00)
GFR, Estimated: >60 mL/min (ref >60)
Glucose, Bld: 109 mg/dL — ABNORMAL HIGH (ref 70–99)
Potassium: 3.5 mmol/L (ref 3.5–5.1)
Sodium: 132 mmol/L — ABNORMAL LOW (ref 135–145)
Total Bilirubin: 1.1 mg/dL (ref 0.3–1.2)
Total Protein: 7.2 g/dL (ref 6.5–8.1)

## 2020-11-26 LAB — CBC WITH DIFFERENTIAL/PLATELET
Abs Immature Granulocytes: 0.07 K/uL (ref 0.00–0.07)
Basophils Absolute: 0 K/uL (ref 0.0–0.1)
Basophils Relative: 0 %
Eosinophils Absolute: 0 K/uL (ref 0.0–0.5)
Eosinophils Relative: 1 %
HCT: 32.9 % — ABNORMAL LOW (ref 36.0–46.0)
Hemoglobin: 11.3 g/dL — ABNORMAL LOW (ref 12.0–15.0)
Immature Granulocytes: 1 %
Lymphocytes Relative: 26 %
Lymphs Abs: 1.8 K/uL (ref 0.7–4.0)
MCH: 37.5 pg — ABNORMAL HIGH (ref 26.0–34.0)
MCHC: 34.3 g/dL (ref 30.0–36.0)
MCV: 109.3 fL — ABNORMAL HIGH (ref 80.0–100.0)
Monocytes Absolute: 0.9 K/uL (ref 0.1–1.0)
Monocytes Relative: 13 %
Neutro Abs: 4 K/uL (ref 1.7–7.7)
Neutrophils Relative %: 59 %
Platelets: 216 K/uL (ref 150–400)
RBC: 3.01 MIL/uL — ABNORMAL LOW (ref 3.87–5.11)
RDW: 13.2 % (ref 11.5–15.5)
WBC: 6.8 K/uL (ref 4.0–10.5)
nRBC: 0 % (ref 0.0–0.2)

## 2020-11-26 LAB — LACTIC ACID, PLASMA: Lactic Acid, Venous: 1.1 mmol/L (ref 0.5–1.9)

## 2020-11-26 LAB — SEDIMENTATION RATE: Sed Rate: 119 mm/hr — ABNORMAL HIGH (ref 0–30)

## 2020-11-26 NOTE — ED Triage Notes (Signed)
Pt reports that she had a knee replacement on Tuesday, her left leg is swollen, warm to touch and red.

## 2021-03-16 ENCOUNTER — Other Ambulatory Visit: Payer: Self-pay

## 2021-03-16 ENCOUNTER — Emergency Department
Admission: EM | Admit: 2021-03-16 | Discharge: 2021-03-16 | Disposition: A | Discharge status: Home / Self Care | Payer: No Typology Code available for payment source | Attending: Emergency Medicine | Admitting: Emergency Medicine

## 2021-03-16 DIAGNOSIS — F17210 Nicotine dependence, cigarettes, uncomplicated: Secondary | ICD-10-CM | POA: Insufficient documentation

## 2021-03-16 DIAGNOSIS — Y908 Blood alcohol level of 240 mg/100 ml or more: Secondary | ICD-10-CM | POA: Diagnosis not present

## 2021-03-16 DIAGNOSIS — I10 Essential (primary) hypertension: Secondary | ICD-10-CM | POA: Insufficient documentation

## 2021-03-16 DIAGNOSIS — F10220 Alcohol dependence with intoxication, uncomplicated: Secondary | ICD-10-CM | POA: Insufficient documentation

## 2021-03-16 DIAGNOSIS — Z7901 Long term (current) use of anticoagulants: Secondary | ICD-10-CM | POA: Insufficient documentation

## 2021-03-16 DIAGNOSIS — Z20822 Contact with and (suspected) exposure to covid-19: Secondary | ICD-10-CM | POA: Insufficient documentation

## 2021-03-16 DIAGNOSIS — Z96652 Presence of left artificial knee joint: Secondary | ICD-10-CM | POA: Diagnosis not present

## 2021-03-16 DIAGNOSIS — Z79899 Other long term (current) drug therapy: Secondary | ICD-10-CM | POA: Diagnosis not present

## 2021-03-16 DIAGNOSIS — F10920 Alcohol use, unspecified with intoxication, uncomplicated: Secondary | ICD-10-CM

## 2021-03-16 LAB — COMPREHENSIVE METABOLIC PANEL
ALT: 13 U/L (ref 0–44)
AST: 28 U/L (ref 15–41)
Albumin: 3.6 g/dL (ref 3.5–5.0)
Alkaline Phosphatase: 71 U/L (ref 38–126)
Anion gap: 8 (ref 5–15)
BUN: 13 mg/dL (ref 6–20)
CO2: 24 mmol/L (ref 22–32)
Calcium: 8.4 mg/dL — ABNORMAL LOW (ref 8.9–10.3)
Chloride: 106 mmol/L (ref 98–111)
Creatinine, Ser: 0.82 mg/dL (ref 0.44–1.00)
GFR, Estimated: >60 mL/min (ref >60)
Glucose, Bld: 99 mg/dL (ref 70–99)
Potassium: 3.7 mmol/L (ref 3.5–5.1)
Sodium: 138 mmol/L (ref 135–145)
Total Bilirubin: 0.5 mg/dL (ref 0.3–1.2)
Total Protein: 7.5 g/dL (ref 6.5–8.1)

## 2021-03-16 LAB — CBC WITH DIFFERENTIAL/PLATELET
Abs Immature Granulocytes: 0.01 10*3/uL (ref 0.00–0.07)
Basophils Absolute: 0 10*3/uL (ref 0.0–0.1)
Basophils Relative: 0 %
Eosinophils Absolute: 0.1 10*3/uL (ref 0.0–0.5)
Eosinophils Relative: 1 %
HCT: 43.7 % (ref 36.0–46.0)
Hemoglobin: 15 g/dL (ref 12.0–15.0)
Immature Granulocytes: 0 %
Lymphocytes Relative: 63 %
Lymphs Abs: 3 10*3/uL (ref 0.7–4.0)
MCH: 35.4 pg — ABNORMAL HIGH (ref 26.0–34.0)
MCHC: 34.3 g/dL (ref 30.0–36.0)
MCV: 103.1 fL — ABNORMAL HIGH (ref 80.0–100.0)
Monocytes Absolute: 0.2 10*3/uL (ref 0.1–1.0)
Monocytes Relative: 5 %
Neutro Abs: 1.5 10*3/uL — ABNORMAL LOW (ref 1.7–7.7)
Neutrophils Relative %: 31 %
Platelets: 313 10*3/uL (ref 150–400)
RBC: 4.24 MIL/uL (ref 3.87–5.11)
RDW: 13.2 % (ref 11.5–15.5)
WBC: 4.7 10*3/uL (ref 4.0–10.5)
nRBC: 0 % (ref 0.0–0.2)

## 2021-03-16 LAB — RESP PANEL BY RT-PCR (FLU A&B, COVID) ARPGX2
Influenza A by PCR: NEGATIVE
Influenza B by PCR: NEGATIVE
SARS Coronavirus 2 by RT PCR: NEGATIVE

## 2021-03-16 LAB — SALICYLATE LEVEL: Salicylate Lvl: <7 mg/dL — ABNORMAL LOW (ref 7.0–30.0)

## 2021-03-16 LAB — LIPASE, BLOOD: Lipase: 33 U/L (ref 11–51)

## 2021-03-16 LAB — ACETAMINOPHEN LEVEL: Acetaminophen (Tylenol), Serum: <10 ug/mL — ABNORMAL LOW (ref 10–30)

## 2021-03-16 LAB — ETHANOL: Alcohol, Ethyl (B): 337 mg/dL (ref <10)

## 2021-03-16 MED ORDER — SODIUM CHLORIDE 0.9 % IV BOLUS
1000.0000 mL | Freq: Once | INTRAVENOUS | Status: AC
  Administered 2021-03-16: 1000 mL via INTRAVENOUS

## 2021-03-16 MED ORDER — ONDANSETRON HCL 4 MG/2ML IJ SOLN
INTRAMUSCULAR | Status: AC
  Administered 2021-03-16: 4 mg
  Filled 2021-03-16: qty 2

## 2021-03-16 NOTE — ED Notes (Signed)
Pt being discharged and requesting to wait for ride in lobby. Pt does not have a vehicle on the premises and came by EMS. Pt A&Ox4 and ambulatory. Pt taken to lobby in wheelchair by RN.

## 2021-03-16 NOTE — ED Notes (Signed)
Pt given meal tray.

## 2021-03-16 NOTE — ED Provider Notes (Signed)
[facility] Emergency Department Provider Note  ____________________________________________  Time seen: Approximately 4:59 PM  I have reviewed the triage vital signs and the nursing notes.   HISTORY  Chief Complaint Alcohol Intoxication (Weakness)    HPI Lindsay Price is a 50 y.o. female with a history of alcohol abuse hypertension and lupus who comes ED complaining of "something is not feeling right."  She reports feeling very upset because she has come to believe Price her husband is cheating on her.  She normally drinks 240 ounce beers a day and reports Price she has been drinking heavily today.  She is not taking her depression medicine.  Denies SI HI or hallucinations.  Denies pain but does feel fatigued and have generalized body aches and a nonproductive cough.    Past Medical History:  Diagnosis Date  . Alcoholism (HCC)   . Arthritis   . Hypertension   . Lupus [facility])      Patient Active Problem List   Diagnosis Date Noted  . Status post total knee replacement using cement, left 11/22/2020  . Fall   . Rib fracture 05/04/2020  . Amitriptyline overdose of undetermined intent 11/13/2017  . Alcohol abuse 11/13/2017  . Substance induced mood disorder (HCC) 11/13/2017     Past Surgical History:  Procedure Laterality Date  . FOREIGN BODY REMOVAL  2015   BULLET REMOVED RIGHT CHEST  . HERNIA REPAIR     UMBILICAL  . NO PAST SURGERIES    . TOTAL KNEE ARTHROPLASTY Left 11/22/2020   Procedure: TOTAL KNEE ARTHROPLASTY;  Surgeon: Christena Flake, MD;  Location: [facility] ORS;  Service: Orthopedics;  Laterality: Left;     Prior to Admission medications   Medication Sig Start Date End Date Taking? Authorizing Provider  apixaban (ELIQUIS) 2.5 MG TABS tablet Take 1 tablet (2.5 mg total) by mouth 2 (two) times daily. 11/23/20   Anson Oregon, PA-C  Liniments (BLUE-EMU SUPER STRENGTH) CREA Apply 1 application topically as needed. PAIN NO MORE THAN 3 DOSES  PER 24 HOURS    [provider]  Menthol, Topical Analgesic, (ICY HOT EX) Apply 1 application topically daily as needed (pain).    [provider]  ondansetron (ZOFRAN) 4 MG tablet Take 1 tablet (4 mg total) by mouth every 6 (six) hours as needed for nausea. 11/23/20   Anson Oregon, PA-C  oxyCODONE (OXY IR/ROXICODONE) 5 MG immediate release tablet Take 1-2 tablets (5-10 mg total) by mouth every 4 (four) hours as needed for moderate pain (pain score 4-6). 11/23/20   Anson Oregon, PA-C  tiZANidine (ZANAFLEX) 4 MG tablet Take 1 tablet (4 mg total) by mouth every 8 (eight) hours as needed for muscle spasms. 11/02/20   Tommie Sams, DO  traMADol (ULTRAM) 50 MG tablet Take 1 tablet (50 mg total) by mouth every 6 (six) hours as needed for moderate pain. 11/23/20   Anson Oregon, PA-C  amLODipine (NORVASC) 10 MG tablet Take 1 tablet (10 mg total) by mouth daily. 05/05/20 09/19/20  Enedina Finner, MD     Allergies Patient has no known allergies.   No family history on file.  Social History Social History   Tobacco Use  . Smoking status: Every Day    Packs/day: 0.50    Types: Cigarettes  . Smokeless tobacco: Never  Vaping Use  . Vaping Use: Never used  Substance Use Topics  . Alcohol use: Yes    Alcohol/week: 98.0 standard drinks  Types: 28 Cans of beer, 70 Shots of liquor per week    Comment: STATES 4 CURRENTLY 4 40OZ CANS IN A WEEK  . Drug use: Yes    Types: Marijuana    Review of Systems  Constitutional:   No fever positive chills.  ENT:   No sore throat. No rhinorrhea. Cardiovascular:   No chest pain or syncope. Respiratory:   No dyspnea positive cough. Gastrointestinal:   Negative for abdominal pain, vomiting and diarrhea.  Musculoskeletal:   Negative for focal pain or swelling All other systems reviewed and are negative except as documented above in ROS and HPI.  ____________________________________________   PHYSICAL EXAM:  VITAL  SIGNS: ED Triage Vitals  Enc Vitals Group     BP 03/16/21 1526 (!) 129/101     Pulse Rate 03/16/21 1526 (!) 116     Resp 03/16/21 1526 18     Temp 03/16/21 1526 98.9 F (37.2 C)     Temp Source 03/16/21 1526 Oral     SpO2 03/16/21 1526 97 %     Weight 03/16/21 1527 179 lb 14.3 oz (81.6 kg)     Height 03/16/21 1527 5\' 11"  (1.803 m)     Head Circumference --      Peak Flow --      Pain Score 03/16/21 1527 5     Pain Loc --      Pain Edu? --      Excl. in GC? --     Vital signs reviewed, nursing assessments reviewed.   Constitutional:   Alert and oriented. Non-toxic appearance. Eyes:   Conjunctivae are normal. EOMI. PERRL. ENT      Head:   Normocephalic and atraumatic.      Nose:   Wearing a mask.      Mouth/Throat:   Wearing a mask.      Neck:   No meningismus. Full ROM. Hematological/Lymphatic/Immunilogical:   No cervical lymphadenopathy. Cardiovascular:   RRR. Symmetric bilateral radial and DP pulses.  No murmurs. Cap refill less than 2 seconds. Respiratory:   Normal respiratory effort without tachypnea/retractions. Breath sounds are clear and equal bilaterally. No wheezes/rales/rhonchi. Gastrointestinal:   Soft and nontender. Non distended. There is no CVA tenderness.  No rebound, rigidity, or guarding. Genitourinary:   deferred Musculoskeletal:   Normal range of motion in all extremities. No joint effusions.  No lower extremity tenderness.  No edema. Neurologic:   Slurred speech.  Normal language. Steady gait Motor grossly intact. No acute focal neurologic deficits are appreciated.  Skin:    Skin is warm, dry and intact. No rash noted.  No petechiae, purpura, or bullae.  ____________________________________________    LABS (pertinent positives/negatives) (all labs ordered are listed, but only abnormal results are displayed) Labs Reviewed  ACETAMINOPHEN LEVEL - Abnormal; Notable for the following components:      Result Value   Acetaminophen (Tylenol), Serum <10  (*)    All other components within normal limits  COMPREHENSIVE METABOLIC PANEL - Abnormal; Notable for the following components:   Calcium 8.4 (*)    All other components within normal limits  ETHANOL - Abnormal; Notable for the following components:   Alcohol, Ethyl (B) 337 (*)    All other components within normal limits  SALICYLATE LEVEL - Abnormal; Notable for the following components:   Salicylate Lvl <7.0 (*)    All other components within normal limits  CBC WITH DIFFERENTIAL/PLATELET - Abnormal; Notable for the following components:   MCV 103.1 (*)  MCH 35.4 (*)    Neutro Abs 1.5 (*)    All other components within normal limits  RESP PANEL BY RT-PCR (FLU A&B, COVID) ARPGX2  LIPASE, BLOOD   ____________________________________________   EKG    ____________________________________________    RADIOLOGY  No results found.  ____________________________________________   PROCEDURES Procedures  ____________________________________________    CLINICAL IMPRESSION / ASSESSMENT AND PLAN / ED COURSE  Medications ordered in the ED: Medications  ondansetron (ZOFRAN) 4 MG/2ML injection (4 mg  Given 03/16/21 1532)  sodium chloride 0.9 % bolus 1,000 mL (1,000 mLs Intravenous New Bag/Given 03/16/21 1531)    Pertinent labs & imaging results Price were available during my care of the patient were reviewed by me and considered in my medical decision making (see chart for details).  Alfredia Client was evaluated in Emergency Department on 03/16/2021 for the symptoms described in the history of present illness. She was evaluated in the context of the global COVID-19 pandemic, which necessitated consideration Price the patient might be at risk for infection with the SARS-CoV-2 virus Price causes COVID-19. Institutional protocols and algorithms Price pertain to the evaluation of patients at risk for COVID-19 are in a state of rapid change based on information released by regulatory  bodies including the CDC and federal and state organizations. These policies and algorithms were followed during the patient's care in the ED.   Patient presents with alcohol intoxication as well as some constitutional symptoms suggestive of a viral illness.  No other focal symptoms.  Will check labs, flu swab, give IV fluids and Zofran for supportive care.  She is overall nontoxic and medically stable.  She will be suitable for discharge once clinically sober.  Clinical Course as of 03/16/21 1746  Thu Mar 16, 2021  1745 Daughter able to come pick pt up now. Pt tolerating PO. Steady gait. Speech normal. Clinically sober, stable for DC.  [PS]    Clinical Course User Index [PS] Sharman Cheek, MD     ____________________________________________   FINAL CLINICAL IMPRESSION(S) / ED DIAGNOSES    Final diagnoses:  Alcoholic intoxication without complication [facility])     ED Discharge Orders     None       Portions of this note were generated with dragon dictation software. Dictation errors may occur despite best attempts at proofreading.    Sharman Cheek, MD 03/16/21 [phone]

## 2021-03-16 NOTE — ED Notes (Signed)
Pt stating she is getting anxious and wants to leave. RN disclosing to pt that it is not safe for her to leave by herself due to her ethanol level. MD made aware of pt concerns.

## 2021-03-16 NOTE — ED Triage Notes (Signed)
Pt to ED via [facility] from home. Pt called due to not feeling well. Pt with hx anxiety and depression and has been out of medication x45month. Pt states she drinks about 1-2 40oz beers daily. Pt states she has hx of seizures when she withdraws. Pt stating she had two seizures in the ambulance in route. Pt denies SI/HI. CBG 132. 20g RAC. HR 114.

## 2021-08-08 ENCOUNTER — Ambulatory Visit: Payer: Self-pay | Admitting: Family Medicine

## 2021-11-21 ENCOUNTER — Ambulatory Visit (INDEPENDENT_AMBULATORY_CARE_PROVIDER_SITE_OTHER): Payer: Medicaid Other | Admitting: Family Medicine

## 2021-11-21 ENCOUNTER — Encounter: Payer: Self-pay | Admitting: Family Medicine

## 2021-11-21 VITALS — BP 140/100 | HR 88 | Ht 71.0 in | Wt 178.0 lb

## 2021-11-21 DIAGNOSIS — I10 Essential (primary) hypertension: Secondary | ICD-10-CM

## 2021-11-21 MED ORDER — CHLORTHALIDONE 25 MG PO TABS: 25.0000 mg | ORAL_TABLET | Freq: Every day | ORAL | 1 refills | Status: DC

## 2021-11-21 NOTE — Progress Notes (Signed)
Date:  11/21/2021   Name:  Lindsay Price   DOB:  05-18-1970   MRN:  [id number]   Chief Complaint: Establish Care  Patient is a 51year old female who presents for a establish care exam. The patient reports the following problems: Elevated blood pressure. Health maintenance has been reviewed multiple needs need to be met    Hypertension This is a chronic problem. The current episode started more than 1 year ago. The problem is controlled. Pertinent negatives include no chest pain, neck pain, palpitations or shortness of breath. Risk factors for coronary artery disease include smoking/tobacco exposure. Past treatments include nothing.    Lab Results  Component Value Date   NA 138 03/16/2021   K 3.7 03/16/2021   CO2 24 03/16/2021   GLUCOSE 99 03/16/2021   BUN 13 03/16/2021   CREATININE 0.82 03/16/2021   CALCIUM 8.4 (L) 03/16/2021   GFRNONAA >60 03/16/2021   No results found for: "CHOL", "HDL", "LDLCALC", "LDLDIRECT", "TRIG", "CHOLHDL" Lab Results  Component Value Date   TSH 1.542 05/04/2020   Lab Results  Component Value Date   HGBA1C 4.9 05/04/2020   Lab Results  Component Value Date   WBC 4.7 03/16/2021   HGB 15.0 03/16/2021   HCT 43.7 03/16/2021   MCV 103.1 (H) 03/16/2021   PLT 313 03/16/2021   Lab Results  Component Value Date   ALT 13 03/16/2021   AST 28 03/16/2021   ALKPHOS 71 03/16/2021   BILITOT 0.5 03/16/2021   No results found for: "25OHVITD2", "25OHVITD3", "VD25OH"   Review of Systems  Respiratory:  Negative for cough, shortness of breath and wheezing.   Cardiovascular:  Negative for chest pain, palpitations and leg swelling.  Gastrointestinal:  Negative for diarrhea and nausea.  Musculoskeletal:  Positive for arthralgias. Negative for myalgias and neck pain.  Skin:  Negative for rash.  Psychiatric/Behavioral:  Negative for suicidal ideas. The patient is not nervous/anxious.     Patient Active Problem List   Diagnosis Date Noted   Status post  total knee replacement using cement, left 11/22/2020   Fall    Rib fracture 05/04/2020   Amitriptyline overdose of undetermined intent 11/13/2017   Alcohol abuse 11/13/2017   Substance induced mood disorder ([place]) 11/13/2017    No Known Allergies  Past Surgical History:  Procedure Laterality Date   FOREIGN BODY REMOVAL  2015   BULLET REMOVED RIGHT CHEST   HERNIA REPAIR     UMBILICAL   NO PAST SURGERIES     TOTAL KNEE ARTHROPLASTY Left 11/22/2020   Procedure: TOTAL KNEE ARTHROPLASTY;  Surgeon: Corky Mull, MD;  Location: [facility] ORS;  Service: Orthopedics;  Laterality: Left;    Social History   Tobacco Use   Smoking status: Every Day    Packs/day: 0.50    Types: Cigarettes   Smokeless tobacco: Never  Vaping Use   Vaping Use: Never used  Substance Use Topics   Alcohol use: Not Currently    Comment: states 24 ounce can every 2 weeks   Drug use: Yes    Types: Marijuana     Medication list has been reviewed and updated.  Current Meds  Medication Sig   Menthol, Topical Analgesic, (ICY HOT EX) Apply 1 application topically daily as needed (pain).       11/21/2021    2:43 PM  GAD 7 : Generalized Anxiety Score  Nervous, Anxious, on Edge 2  Control/stop worrying 3  Worry too much - different things 3  Trouble relaxing 3  Restless 2  Easily annoyed or irritable 2  Afraid - awful might happen 0  Total GAD 7 Score 15  Anxiety Difficulty Somewhat difficult       11/21/2021    2:42 PM  Depression screen PHQ 2/9  Decreased Interest 0  Down, Depressed, Hopeless 3  PHQ - 2 Score 3  Altered sleeping 3  Tired, decreased energy 2  Change in appetite 0  Feeling bad or failure about yourself  0  Trouble concentrating 1  Moving slowly or fidgety/restless 0  Suicidal thoughts 0  PHQ-9 Score 9  Difficult doing work/chores Somewhat difficult    BP Readings from Last 3 Encounters:  11/21/21 (!) 160/92  03/16/21 (!) 129/101  11/26/20 127/78    Physical Exam Vitals  and nursing note reviewed. Exam conducted with a chaperone present.  Constitutional:      General: She is not in acute distress.    Appearance: She is not diaphoretic.  HENT:     Head: Normocephalic and atraumatic.  Neck:     Thyroid: No thyromegaly.     Vascular: No JVD.  Cardiovascular:     Rate and Rhythm: Normal rate and regular rhythm.     Heart sounds: Normal heart sounds. No murmur heard.    No friction rub. No gallop.  Pulmonary:     Effort: Pulmonary effort is normal.     Breath sounds: Normal breath sounds. No wheezing, rhonchi or rales.  Abdominal:     General: Bowel sounds are normal.     Palpations: Abdomen is soft. There is no mass.     Tenderness: There is no abdominal tenderness. There is no guarding.  Musculoskeletal:        General: Normal range of motion.     Cervical back: Normal range of motion and neck supple.  Lymphadenopathy:     Cervical: No cervical adenopathy.  Skin:    General: Skin is warm and dry.  Neurological:     Mental Status: She is alert.     Deep Tendon Reflexes: Reflexes are normal and symmetric.     Wt Readings from Last 3 Encounters:  11/21/21 178 lb (80.7 kg)  03/16/21 179 lb 14.3 oz (81.6 kg)  11/26/20 180 lb (81.6 kg)    BP (!) 160/92   Pulse 88   Ht _0  (1.803 m)   Wt 178 lb (80.7 kg)   LMP 08/16/2016   BMI 24.83 kg/m   Assessment and Plan:  1. Primary hypertension Chronic.  Has been supposed to be on medications in the past but for various reasons she has not been taking any medical intervention for hypertension.  We will initially began with chlorthalidone 25 mg daily we will recheck patient in 4 weeks at which time she will be able to get her car with my name on it for her Medicaid.  At that time we will also draw lab work for electrolytes GFR and likely lipids. - chlorthalidone (HYGROTON) 25 MG tablet; Take 1 tablet (25 mg total) by mouth daily.  Dispense: 30 tablet; Refill: 1

## 2021-12-19 ENCOUNTER — Ambulatory Visit: Payer: Medicaid Other | Admitting: Family Medicine

## 2021-12-21 ENCOUNTER — Encounter: Payer: Self-pay | Admitting: Family Medicine

## 2021-12-26 ENCOUNTER — Ambulatory Visit: Payer: Self-pay

## 2021-12-26 NOTE — Telephone Encounter (Signed)
  Chief Complaint: rectal bleeding Symptoms: rectal bleeding, intermittent Frequency: ongoing for a while.  Pertinent Negatives: Patient denies rectal pain or diarrhea or constipation Disposition: [] ED /[] Urgent Care (no appt availability in office) / [x] Appointment(In office/virtual)/ []  [place] [facility]/ [] Home Care/ [] Refused Recommended Disposition /[] [place] [facility]/ []  Follow-up with PCP Additional Notes: pt states bleeding isn't bad this week compared to other weeks. Preferred to only see Dr. . Pt scheduled for first available on 01/01/22 at 1440. Advised to call back if bleeding gets worse.   Reason for Disposition  MILD rectal bleeding (more than just a few drops or streaks)  Answer Assessment - Initial Assessment Questions 1. APPEARANCE of BLOOD: "What color is it?" "Is it passed separately, on the surface of the stool, or mixed in with the stool?"      Passed separately  2. AMOUNT: "How much blood was passed?"      Medium large amounts  3. FREQUENCY: "How many times has blood been passed with the stools?"      Last week  4. ONSET: "When was the blood first seen in the stools?" (Days or weeks)      On and off for a while 8. BLOOD THINNERS: "Do you take any blood thinners?" (e.g., Coumadin/warfarin, Pradaxa/dabigatran, aspirin)     no 9. OTHER SYMPTOMS: "Do you have any other symptoms?"  (e.g., abdomen pain, vomiting, dizziness, fever)  Protocols used: Rectal Bleeding-A-AH

## 2022-01-01 ENCOUNTER — Ambulatory Visit: Payer: Medicaid Other | Admitting: Family Medicine

## 2022-01-04 ENCOUNTER — Encounter: Payer: Medicaid Other | Admitting: Family Medicine

## 2022-01-19 NOTE — Progress Notes (Signed)
Appointment cancelled

## 2022-05-05 ENCOUNTER — Emergency Department
Admission: EM | Admit: 2022-05-05 | Discharge: 2022-05-05 | Disposition: A | Discharge status: Home / Self Care | Payer: Medicaid Other | Attending: Emergency Medicine | Admitting: Emergency Medicine

## 2022-05-05 ENCOUNTER — Encounter: Payer: Self-pay | Admitting: Emergency Medicine

## 2022-05-05 ENCOUNTER — Emergency Department: Payer: Medicaid Other

## 2022-05-05 ENCOUNTER — Other Ambulatory Visit: Payer: Self-pay

## 2022-05-05 DIAGNOSIS — I10 Essential (primary) hypertension: Secondary | ICD-10-CM | POA: Diagnosis not present

## 2022-05-05 DIAGNOSIS — K922 Gastrointestinal hemorrhage, unspecified: Secondary | ICD-10-CM | POA: Insufficient documentation

## 2022-05-05 DIAGNOSIS — K625 Hemorrhage of anus and rectum: Secondary | ICD-10-CM | POA: Diagnosis present

## 2022-05-05 LAB — URINALYSIS, COMPLETE (UACMP) WITH MICROSCOPIC
Bacteria, UA: NONE SEEN
Bilirubin Urine: NEGATIVE
Glucose, UA: NEGATIVE mg/dL
Ketones, ur: NEGATIVE mg/dL
Leukocytes,Ua: NEGATIVE
Nitrite: NEGATIVE
Protein, ur: NEGATIVE mg/dL
Specific Gravity, Urine: 1.001 — ABNORMAL LOW (ref 1.005–1.030)
Squamous Epithelial / HPF: NONE SEEN /HPF (ref 0–5)
pH: 6 (ref 5.0–8.0)

## 2022-05-05 LAB — CBC WITH DIFFERENTIAL/PLATELET
Abs Immature Granulocytes: 0.02 10*3/uL (ref 0.00–0.07)
Basophils Absolute: 0 10*3/uL (ref 0.0–0.1)
Basophils Relative: 1 %
Eosinophils Absolute: 0.1 10*3/uL (ref 0.0–0.5)
Eosinophils Relative: 2 %
HCT: 41 % (ref 36.0–46.0)
Hemoglobin: 13.9 g/dL (ref 12.0–15.0)
Immature Granulocytes: 0 %
Lymphocytes Relative: 61 %
Lymphs Abs: 3.3 10*3/uL (ref 0.7–4.0)
MCH: 36.2 pg — ABNORMAL HIGH (ref 26.0–34.0)
MCHC: 33.9 g/dL (ref 30.0–36.0)
MCV: 106.8 fL — ABNORMAL HIGH (ref 80.0–100.0)
Monocytes Absolute: 0.5 10*3/uL (ref 0.1–1.0)
Monocytes Relative: 9 %
Neutro Abs: 1.5 10*3/uL — ABNORMAL LOW (ref 1.7–7.7)
Neutrophils Relative %: 27 %
Platelets: 231 10*3/uL (ref 150–400)
RBC: 3.84 MIL/uL — ABNORMAL LOW (ref 3.87–5.11)
RDW: 13.2 % (ref 11.5–15.5)
WBC: 5.4 10*3/uL (ref 4.0–10.5)
nRBC: 0 % (ref 0.0–0.2)

## 2022-05-05 LAB — TYPE AND SCREEN
ABO/RH(D): O POS
Antibody Screen: NEGATIVE

## 2022-05-05 LAB — COMPREHENSIVE METABOLIC PANEL
ALT: 12 U/L (ref 0–44)
AST: 22 U/L (ref 15–41)
Albumin: 4 g/dL (ref 3.5–5.0)
Alkaline Phosphatase: 62 U/L (ref 38–126)
Anion gap: 9 (ref 5–15)
BUN: 6 mg/dL (ref 6–20)
CO2: 22 mmol/L (ref 22–32)
Calcium: 8.8 mg/dL — ABNORMAL LOW (ref 8.9–10.3)
Chloride: 112 mmol/L — ABNORMAL HIGH (ref 98–111)
Creatinine, Ser: 0.59 mg/dL (ref 0.44–1.00)
GFR, Estimated: >60 mL/min (ref >60)
Glucose, Bld: 103 mg/dL — ABNORMAL HIGH (ref 70–99)
Potassium: 3.7 mmol/L (ref 3.5–5.1)
Sodium: 143 mmol/L (ref 135–145)
Total Bilirubin: 0.4 mg/dL (ref 0.3–1.2)
Total Protein: 7.7 g/dL (ref 6.5–8.1)

## 2022-05-05 LAB — CBC
HCT: 42.3 % (ref 36.0–46.0)
Hemoglobin: 14.7 g/dL (ref 12.0–15.0)
MCH: 36.7 pg — ABNORMAL HIGH (ref 26.0–34.0)
MCHC: 34.8 g/dL (ref 30.0–36.0)
MCV: 105.5 fL — ABNORMAL HIGH (ref 80.0–100.0)
Platelets: 260 10*3/uL (ref 150–400)
RBC: 4.01 MIL/uL (ref 3.87–5.11)
RDW: 13 % (ref 11.5–15.5)
WBC: 5.7 10*3/uL (ref 4.0–10.5)
nRBC: 0 % (ref 0.0–0.2)

## 2022-05-05 LAB — ETHANOL: Alcohol, Ethyl (B): 302 mg/dL (ref <10)

## 2022-05-05 MED ORDER — IOHEXOL 300 MG/ML  SOLN
100.0000 mL | Freq: Once | INTRAMUSCULAR | Status: AC | PRN
  Administered 2022-05-05: 100 mL via INTRAVENOUS

## 2022-05-05 NOTE — Discharge Instructions (Signed)
Your bleeding is likely caused by a hemorrhoid just inside the rectum.  It can also be caused by diverticulosis which are small pouches that formed in your large intestine.  Right now your blood counts are stable and there are no signs that you are actively losing blood.  If you are having hard stools or straining you should take a over-the-counter stool softener such as Colace or laxative such as MiraLAX.  It is very important that you follow-up with a gastroenterologist.  We provided a referral.  In the meantime, return to the ER immediately for new or worsening bleeding, dark or tarry stools, worsening abdominal or rectal pain, weakness or lightheadedness, or any vomiting blood or material that looks like coffee grounds, or any new or worsening symptoms that concern you.

## 2022-05-05 NOTE — ED Provider Notes (Signed)
[facility] Provider Note    Event Date/Time   First MD Initiated Contact with Patient 05/05/22 [phone]     (approximate)   History   Rectal Bleeding   HPI  Lindsay Price is a 51 y.o. female with a history of alcohol abuse, hypertension, lupus, and arthritis who presents with rectal bleeding over the last 2 days.  Patient describes bright red blood that has happened during and just after bowel movements.  She reports 1 episode that was a relatively large quantity.  She denies any vomiting of blood.  She has had some lower abdominal pain.    The patient reports that she has had intermittent rectal bleeding for years and has had a few episodes before like this where she then ended up feeling weak.  She also states that she previously has vomited blood a few times but states this has not happened in a long time.  She was told previously that she did have hemorrhoids.  I reviewed the past medical records.  The patient was last seen in the ED in November of last year with alcohol intoxication and possible viral syndrome.  She was admitted in July 2022 for total knee arthroplasty.  I do not see any prior visits for GI bleeding or any prior GI workup.   Physical Exam   Triage Vital Signs: ED Triage Vitals  Enc Vitals Group     BP 05/05/22 0410 (!) 131/105     Pulse Rate 05/05/22 0410 (!) 102     Resp 05/05/22 0410 20     Temp --      Temp Source 05/05/22 0410 Oral     SpO2 05/05/22 0410 95 %     Weight 05/05/22 0409 181 lb (82.1 kg)     Height 05/05/22 0409 5\' 11"  (1.803 m)     Head Circumference --      Peak Flow --      Pain Score 05/05/22 0409 10     Pain Loc --      Pain Edu? --      Excl. in [place]? --     Most recent vital signs: Vitals:   05/05/22 0410 05/05/22 0913  BP: (!) 131/105 119/82  Pulse: (!) 102 88  Resp: 20 18  Temp:  98.4 F (36.9 C)  SpO2: 95% 98%     General: Alert and oriented, well-appearing. CV:  Good peripheral perfusion.   Resp:  Normal effort.  Abd:  Soft with mild bilateral lower quadrant discomfort.  No peritoneal signs.  No distention.  Other:  Small nonthrombosed external hemorrhoid on rectal exam.  No anal fissures or other lesions.  No stigmata of recent bleeding.  Minimal stool residue on DRE, guaiac negative.   ED Results / Procedures / Treatments   Labs (all labs ordered are listed, but only abnormal results are displayed) Labs Reviewed  COMPREHENSIVE METABOLIC PANEL - Abnormal; Notable for the following components:      Result Value   Chloride 112 (*)    Glucose, Bld 103 (*)    Calcium 8.8 (*)    All other components within normal limits  CBC - Abnormal; Notable for the following components:   MCV 105.5 (*)    MCH 36.7 (*)    All other components within normal limits  URINALYSIS, COMPLETE (UACMP) WITH MICROSCOPIC - Abnormal; Notable for the following components:   Color, Urine STRAW (*)    APPearance CLEAR (*)    Specific  Gravity, Urine 1.001 (*)    Hgb urine dipstick SMALL (*)    All other components within normal limits  ETHANOL - Abnormal; Notable for the following components:   Alcohol, Ethyl (B) 302 (*)    All other components within normal limits  CBC WITH DIFFERENTIAL/PLATELET - Abnormal; Notable for the following components:   RBC 3.84 (*)    MCV 106.8 (*)    MCH 36.2 (*)    Neutro Abs 1.5 (*)    All other components within normal limits  POC OCCULT BLOOD, ED  TYPE AND SCREEN     EKG     RADIOLOGY  CT abdomen/pelvis: I independently viewed and interpreted the images; there are no dilated bowel loops or any free air or free fluid.  Radiology report indicates no acute abnormality.  CBD is dilated with no other acute findings.   PROCEDURES:  Critical Care performed: No  Procedures   MEDICATIONS ORDERED IN ED: Medications  iohexol (OMNIPAQUE) 300 MG/ML solution 100 mL (100 mLs Intravenous Contrast Given 05/05/22 1011)     IMPRESSION / MDM / ASSESSMENT AND  PLAN / ED COURSE  I reviewed the triage vital signs and the nursing notes.  51 year old female with PMH as noted above presents with several episodes of rectal bleeding over the last few days which she describes as bright red blood.  She reports intermittent lower GI bleeding for years but does not appear to have had any workup.  On exam, the patient is well-appearing and is clinically sober at this time.  The abdomen is soft with no focal tenderness.  Rectal exam is unremarkable with no large hemorrhoids and no stigmata of recent bleeding.  Differential diagnosis includes, but is not limited to, lower GI bleed related to internal hemorrhoid, diverticulosis, diverticulitis, infectious colitis.  There is no evidence of upper GI bleed.  Internal hemorrhoid is most likely.  Initial labs show hematocrit of 42.  Electrolytes are unremarkable and the urinalysis is negative.  Ethanol level is 302 on the patient's arrival although this was almost 6 hours ago as well.  We will obtain a repeat CBC now that it has been almost 6 hours as well as a CT of the abdomen.  Patient's presentation is most consistent with acute presentation with potential threat to life or bodily function.  ----------------------------------------- 11:58 AM on 05/05/2022 -----------------------------------------  CT shows no acute findings.  The CBD appears dilated but the patient does not have any right upper quadrant pain, jaundice, or any lab abnormalities to suggest acute biliary obstruction.  This appears to be an incidental finding.  Repeat CBC shows a hematocrit of 41 which is not a clinically significant drop.  The patient has not had any further bowel movements or bleeding in the ED.  She is stable at this time.  I did consider whether the patient may benefit from inpatient admission given the presence of a likely lower GI bleed, however given the stable blood counts, stable vital signs, lack of any concerning exam findings, and  negative imaging, she appears stable for discharge at this time.  The patient states she feels well and is comfortable going home.  I counseled her on the results of the workup.  I have provided GI referral.  I gave her strict return precautions and she expressed understanding.   FINAL CLINICAL IMPRESSION(S) / ED DIAGNOSES   Final diagnoses:  Lower GI bleed     Rx / DC Orders   ED Discharge Orders  None        Note:  This document was prepared using Dragon voice recognition software and may include unintentional dictation errors.    Dionne Bucy, MD 05/05/22 1208

## 2022-05-05 NOTE — ED Triage Notes (Signed)
First RN Note: pt to ED via [facility] with c/o hemorrhoids x 3 days. Per EMS pt with large amounts of blood after using the bathroom. Per EMS VSS en route. Smell of ETOH noted during first interaction with patient. Per EMS pt initially did not want to come to hospital, however after some convincing by daughter and EMS pt was agreeable to come to ED.

## 2022-05-08 ENCOUNTER — Telehealth: Payer: Self-pay

## 2022-05-08 NOTE — Telephone Encounter (Signed)
Transition Care Management Unsuccessful Follow-up Telephone Call  Date of discharge and from where:  05/05/22/ [facility]  Attempts:  1st Attempt  Reason for unsuccessful TCM follow-up call:  Unable to leave message

## 2022-05-08 NOTE — Telephone Encounter (Signed)
Unable to leave a message, but called pt back after she called the office.

## 2022-05-08 NOTE — Telephone Encounter (Signed)
Transition Care Management Unsuccessful Follow-up Telephone Call  Date of discharge and from where:  05/05/2022/ [facility]  Attempts:  3rd Attempt  Reason for unsuccessful TCM follow-up call:  Unable to leave message

## 2022-05-08 NOTE — Telephone Encounter (Signed)
Patient returned phone call. °

## 2022-05-08 NOTE — Telephone Encounter (Signed)
Transition Care Management Unsuccessful Follow-up Telephone Call  Date of discharge and from where:  05/05/2022/ [facility]  Attempts:  2nd Attempt  Reason for unsuccessful TCM follow-up call:  Unable to reach patient

## 2022-06-21 ENCOUNTER — Ambulatory Visit: Payer: Medicaid Other | Admitting: Family Medicine

## 2022-06-22 ENCOUNTER — Encounter: Payer: Self-pay | Admitting: Family Medicine

## 2022-08-07 ENCOUNTER — Ambulatory Visit (INDEPENDENT_AMBULATORY_CARE_PROVIDER_SITE_OTHER): Payer: Medicaid Other | Admitting: Family Medicine

## 2022-08-07 ENCOUNTER — Encounter: Payer: Self-pay | Admitting: Family Medicine

## 2022-08-07 VITALS — BP 160/130 | HR 78 | Ht 71.0 in | Wt 163.0 lb

## 2022-08-07 DIAGNOSIS — R531 Weakness: Secondary | ICD-10-CM

## 2022-08-07 DIAGNOSIS — R519 Headache, unspecified: Secondary | ICD-10-CM

## 2022-08-07 DIAGNOSIS — R030 Elevated blood-pressure reading, without diagnosis of hypertension: Secondary | ICD-10-CM

## 2022-08-07 DIAGNOSIS — Z8719 Personal history of other diseases of the digestive system: Secondary | ICD-10-CM

## 2022-08-07 DIAGNOSIS — I10 Essential (primary) hypertension: Secondary | ICD-10-CM

## 2022-08-07 DIAGNOSIS — Z91199 Patient's noncompliance with other medical treatment and regimen due to unspecified reason: Secondary | ICD-10-CM

## 2022-08-07 NOTE — Progress Notes (Signed)
Date:  08/07/2022   Name:  Lindsay Price   DOB:  October 15, 1970   MRN:  [id number]   Chief Complaint: Jaw Pain (Right side, 4 weeks, has started shooting up to ear. )  Neurologic Problem The patient's primary symptoms include clumsiness, focal weakness, a loss of balance, slurred speech, a visual change and weakness. Primary symptoms comment: facial assymetry(awoke this am)/left leg weakness(awoke this am)/tripping at work/ptsis(awoke this am)/facial sensorydecrased. This is a new problem. The current episode started today. There was facial and lower extremity focality noted. Pertinent negatives include no abdominal pain, chest pain, diaphoresis, headaches, palpitations or shortness of breath. (Facial pain) There is no history of a CVA.    Lab Results  Component Value Date   NA 143 05/05/2022   K 3.7 05/05/2022   CO2 22 05/05/2022   GLUCOSE 103 (H) 05/05/2022   BUN 6 05/05/2022   CREATININE 0.59 05/05/2022   CALCIUM 8.8 (L) 05/05/2022   GFRNONAA >60 05/05/2022   No results found for: "CHOL", "HDL", "LDLCALC", "LDLDIRECT", "TRIG", "CHOLHDL" Lab Results  Component Value Date   TSH 1.542 05/04/2020   Lab Results  Component Value Date   HGBA1C 4.9 05/04/2020   Lab Results  Component Value Date   WBC 5.4 05/05/2022   HGB 13.9 05/05/2022   HCT 41.0 05/05/2022   MCV 106.8 (H) 05/05/2022   PLT 231 05/05/2022   Lab Results  Component Value Date   ALT 12 05/05/2022   AST 22 05/05/2022   ALKPHOS 62 05/05/2022   BILITOT 0.4 05/05/2022   No results found for: "25OHVITD2", "25OHVITD3", "VD25OH"   Review of Systems  Constitutional:  Negative for diaphoresis.  Respiratory:  Negative for chest tightness, shortness of breath and wheezing.   Cardiovascular:  Negative for chest pain, palpitations and leg swelling.  Gastrointestinal:  Positive for blood in stool. Negative for abdominal pain.  Neurological:  Positive for focal weakness, facial asymmetry, speech difficulty, weakness,  numbness and loss of balance. Negative for headaches.    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   Fall    Rib fracture 05/04/2020   Amitriptyline overdose of undetermined intent 11/13/2017   Alcohol abuse 11/13/2017   Substance induced mood disorder 11/13/2017    No Known Allergies  Past Surgical History:  Procedure Laterality Date   FOREIGN BODY REMOVAL  2015   BULLET REMOVED RIGHT CHEST   HERNIA REPAIR     UMBILICAL   NO PAST SURGERIES     TOTAL KNEE ARTHROPLASTY Left 11/22/2020   Procedure: TOTAL KNEE ARTHROPLASTY;  Surgeon: Corky Mull, MD;  Location: [facility] ORS;  Service: Orthopedics;  Laterality: Left;    Social History   Tobacco Use   Smoking status: Every Day    Packs/day: .5    Types: Cigarettes   Smokeless tobacco: Never  Vaping Use   Vaping Use: Never used  Substance Use Topics   Alcohol use: Not Currently    Comment: states 24 ounce can every 2 weeks   Drug use: Yes    Types: Marijuana     Medication list has been reviewed and updated.  No outpatient medications have been marked as taking for the 08/07/22 encounter (Office Visit) with Juline Patch, MD.       11/21/2021    2:43 PM  GAD 7 : Generalized Anxiety Score  Nervous, Anxious, on Edge 2  Control/stop worrying 3  Worry too much - different things 3  Trouble relaxing 3  Restless 2  Easily annoyed or irritable 2  Afraid - awful might happen 0  Total GAD 7 Score 15  Anxiety Difficulty Somewhat difficult       11/21/2021    2:42 PM  Depression screen PHQ 2/9  Decreased Interest 0  Down, Depressed, Hopeless 3  PHQ - 2 Score 3  Altered sleeping 3  Tired, decreased energy 2  Change in appetite 0  Feeling bad or failure about yourself  0  Trouble concentrating 1  Moving slowly or fidgety/restless 0  Suicidal thoughts 0  PHQ-9 Score 9  Difficult doing work/chores Somewhat difficult    BP Readings from  Last 3 Encounters:  08/07/22 (!) 160/130  05/05/22 119/82  11/21/21 (!) 140/100    Physical Exam Vitals and nursing note reviewed.  HENT:     Head:     Jaw: There is normal jaw occlusion.     Salivary Glands: Right salivary gland is tender. Right salivary gland is not diffusely enlarged. Left salivary gland is not diffusely enlarged or tender.  Eyes:     Extraocular Movements: Extraocular movements intact.     Comments: Decreased vision left/ptosis left eye  Neurological:     Mental Status: She is alert.     Cranial Nerves: Cranial nerve deficit present.     Sensory: Sensory deficit present.     Motor: Weakness present.     Wt Readings from Last 3 Encounters:  08/07/22 163 lb (73.9 kg)  05/05/22 181 lb (82.1 kg)  01/04/22 181 lb (82.1 kg)    BP (!) 160/130 (BP Location: Right Arm, Cuff Size: Large)   Pulse 78   Ht 5\' 11"  (1.803 m)   Wt 163 lb (73.9 kg)   LMP 08/16/2016   SpO2 98%   BMI 22.73 kg/m   Assessment and Plan:  1. Acute left-sided weakness New onset.  Persistent.  Patient awoke this morning with facial pain and when further questioning she also noticed that she had some weakness of her face when she looked in the mirror.  There is a ptosis of the left eye and some asymmetry of her spinal with decreased tactile sensation of the left side of her face.  2. Right facial pain Patient was awoken this morning with right facial pain at approximately 3 AM with no further symptoms.  3. Elevated blood pressure reading Initial blood pressure reading today was 160/130.  There is no headache the patient has the above neurologic concerns noted.  4. Primary hypertension Chronic.  Persistent.  Patient was seen in the clinic for the first time on 11/21/2021 and was noted to have a blood pressure of 140/100 initially which on second reading was 160/92.  Patient was initiated on chlorthalidone 25 mg and was to return in 4 weeks for repeat blood pressure check which she did not  do and was a no-show appointments on 12/19/2021, 01/01/2022.  Patient was also noted to have an elevated blood pressure reading on 05/05/2022 when she presented to the emergency room with lower GI bleed at which time blood pressure was noted to be initially 131/105 but patient was discharged with a blood pressure later 119/82.  With transitions of care calls made and patient was a no-show for follow-up on 06/21/2022.  Patient has not been taking any medication for blood pressure it appears over the past several months.  5. History of lower GI bleeding Patient was seen with  lower GI bleed and was to be rechecked afterwards for which she was a no-show for that as well.  Patient has had rectal bleeding noted as late as last week per her history.  6. Noncompliance As noted above patient has a history of noncompliance for recheck in clinical settings and likely medication administration.  We will make a referral to GI at this time for follow-up of lower GI bleeding.   Otilio Miu, MD

## 2022-08-08 ENCOUNTER — Telehealth: Payer: Self-pay | Admitting: Family Medicine

## 2022-08-08 NOTE — Telephone Encounter (Signed)
Copied from [place] [phone]. Topic: Appointment Scheduling - Scheduling Inquiry for Clinic >> Aug 08, 2022  1:53 PM Barbie Haggis wrote: Reason for CRM:  Dr Tammi Sou  from [facility] [phone].. stated he does not want patient to fall through the cracks. Patient will be discharged today and would like for her to be called in a few days and schedule a doctors visit.

## 2022-08-09 ENCOUNTER — Telehealth: Payer: Self-pay

## 2022-08-09 NOTE — Transitions of Care (Post Inpatient/ED Visit) (Signed)
   08/09/2022  Name: Lindsay Price MRN: [id number] DOB: [birth year]  Today's TOC FU Call Status: Today's TOC FU Call Status:: Unsuccessul Call (1st Attempt) Unsuccessful Call (1st Attempt) Date: 08/09/22  Attempted to reach the patient regarding the most recent Inpatient/ED visit.  Follow Up Plan: Additional outreach attempts will be made to reach the patient to complete the Transitions of Care (Post Inpatient/ED visit) call.   Signature Juanda Crumble, [place] Direct Dial [phone]

## 2022-08-10 NOTE — Transitions of Care (Post Inpatient/ED Visit) (Unsigned)
   08/10/2022  Name: Lindsay Price MRN: [id number] DOB: 06/23/1970  Today's TOC FU Call Status: Today's TOC FU Call Status:: Unsuccessful Call (2nd Attempt) Unsuccessful Call (1st Attempt) Date: 08/09/22 Unsuccessful Call (2nd Attempt) Date: 08/10/22  Attempted to reach the patient regarding the most recent Inpatient/ED visit.  Follow Up Plan: Additional outreach attempts will be made to reach the patient to complete the Transitions of Care (Post Inpatient/ED visit) call.   Signature Karena Addison, LPN [facility] [place] Nurse Health Advisor Direct Dial [phone]

## 2022-08-13 NOTE — Transitions of Care (Post Inpatient/ED Visit) (Signed)
   08/13/2022  Name: Lindsay Price MRN: [id number] DOB: 1970-06-09  Today's TOC FU Call Status: Today's TOC FU Call Status:: Unsuccessful Call (3rd Attempt) Unsuccessful Call (1st Attempt) Date: 08/09/22 Unsuccessful Call (2nd Attempt) Date: 08/10/22 Unsuccessful Call (3rd Attempt) Date: 08/13/22  Attempted to reach the patient regarding the most recent Inpatient/ED visit.  Follow Up Plan: No further outreach attempts will be made at this time. We have been unable to contact the patient.  Signature Karena Addison, LPN [facility] Nurse Health Advisor Direct Dial [phone]

## 2022-08-14 ENCOUNTER — Encounter: Payer: Self-pay | Admitting: Family Medicine

## 2022-08-14 ENCOUNTER — Ambulatory Visit (INDEPENDENT_AMBULATORY_CARE_PROVIDER_SITE_OTHER): Payer: Medicaid Other | Admitting: Family Medicine

## 2022-08-14 VITALS — BP 122/104 | HR 86 | Ht 71.0 in | Wt 188.0 lb

## 2022-08-14 DIAGNOSIS — I10 Essential (primary) hypertension: Secondary | ICD-10-CM | POA: Diagnosis not present

## 2022-08-14 DIAGNOSIS — E7801 Familial hypercholesterolemia: Secondary | ICD-10-CM

## 2022-08-14 DIAGNOSIS — I679 Cerebrovascular disease, unspecified: Secondary | ICD-10-CM

## 2022-08-14 DIAGNOSIS — F17210 Nicotine dependence, cigarettes, uncomplicated: Secondary | ICD-10-CM | POA: Diagnosis not present

## 2022-08-14 MED ORDER — HYDROCHLOROTHIAZIDE 12.5 MG PO TABS: 12.5000 mg | ORAL_TABLET | Freq: Every day | ORAL | 0 refills | Status: DC

## 2022-08-14 MED ORDER — LISINOPRIL 10 MG PO TABS: 10.0000 mg | ORAL_TABLET | Freq: Every day | ORAL | 0 refills | Status: DC

## 2022-08-14 MED ORDER — ATORVASTATIN CALCIUM 80 MG PO TABS: 80.0000 mg | ORAL_TABLET | Freq: Every day | ORAL | 0 refills | Status: DC

## 2022-08-14 NOTE — Progress Notes (Signed)
Date:  08/14/2022   Name:  Lindsay Price   DOB:  13-Nov-1970   MRN:  [id number]   Chief Complaint: Hospitalization Follow-up (Admitted on 4/2 with left sided weakness. D/c on 4/3 and TOC placed on 08/09/22)  Follow up Hospitalization  Patient was admitted to [facility] on 4/2 and discharged on 4/3. She was treated for cva symptoms.. Treatment for this included htn/hyperlipidemia/ . Telephone follow up was done on 4/4 She reports fair compliance with treatment./still smoking She reports this condition is improved.  ----------------------------------------------------------------------------------------- -     Hypertension This is a chronic problem. The current episode started more than 1 year ago. The problem has been gradually improving since onset. The problem is controlled. Associated symptoms include shortness of breath. Pertinent negatives include no chest pain, orthopnea, palpitations or PND. There are no associated agents to hypertension. Risk factors for coronary artery disease include smoking/tobacco exposure. Past treatments include ACE inhibitors. The current treatment provides moderate improvement. There are no compliance problems.  Hypertensive end-organ damage includes CVA. There is no history of CAD/MI.  Hyperlipidemia This is a chronic problem. The current episode started more than 1 year ago. The problem is controlled. Recent lipid tests were reviewed and are normal. Associated symptoms include a focal weakness and shortness of breath. Pertinent negatives include no chest pain. Current antihyperlipidemic treatment includes statins. The current treatment provides moderate improvement of lipids.  Nicotine Dependence Presents for follow-up visit. Her urge triggers include company of smokers. The symptoms have been stable. Compliance with prior treatments has been good.  Neurologic Problem The patient's primary symptoms include focal weakness and a visual change. This is a  chronic problem. The current episode started more than 1 year ago. The problem has been waxing and waning since onset. There was left-sided, facial, upper extremity and lower extremity focality noted. Associated symptoms include shortness of breath. Pertinent negatives include no abdominal pain, chest pain or palpitations.    Lab Results  Component Value Date   NA 143 05/05/2022   K 3.7 05/05/2022   CO2 22 05/05/2022   GLUCOSE 103 (H) 05/05/2022   BUN 6 05/05/2022   CREATININE 0.59 05/05/2022   CALCIUM 8.8 (L) 05/05/2022   GFRNONAA >60 05/05/2022   No results found for: "CHOL", "HDL", "LDLCALC", "LDLDIRECT", "TRIG", "CHOLHDL" Lab Results  Component Value Date   TSH 1.542 05/04/2020   Lab Results  Component Value Date   HGBA1C 4.9 05/04/2020   Lab Results  Component Value Date   WBC 5.4 05/05/2022   HGB 13.9 05/05/2022   HCT 41.0 05/05/2022   MCV 106.8 (H) 05/05/2022   PLT 231 05/05/2022   Lab Results  Component Value Date   ALT 12 05/05/2022   AST 22 05/05/2022   ALKPHOS 62 05/05/2022   BILITOT 0.4 05/05/2022   No results found for: "25OHVITD2", "25OHVITD3", "VD25OH"   Review of Systems  HENT:  Negative for trouble swallowing.   Eyes:  Negative for visual disturbance.  Respiratory:  Positive for shortness of breath. Negative for cough, choking and wheezing.   Cardiovascular:  Negative for chest pain, palpitations, orthopnea and PND.  Gastrointestinal:  Negative for abdominal distention, abdominal pain, blood in stool, constipation and diarrhea.  Endocrine: Negative for polydipsia and polyuria.  Genitourinary:  Negative for difficulty urinating.  Neurological:  Positive for focal weakness.    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   Fall  Rib fracture 05/04/2020   Amitriptyline overdose of undetermined intent 11/13/2017   Alcohol abuse 11/13/2017   Substance induced mood disorder  11/13/2017    No Known Allergies  Past Surgical History:  Procedure Laterality Date   FOREIGN BODY REMOVAL  2015   BULLET REMOVED RIGHT CHEST   HERNIA REPAIR     UMBILICAL   NO PAST SURGERIES     TOTAL KNEE ARTHROPLASTY Left 11/22/2020   Procedure: TOTAL KNEE ARTHROPLASTY;  Surgeon: Christena Flake, MD;  Location: [facility] ORS;  Service: Orthopedics;  Laterality: Left;    Social History   Tobacco Use   Smoking status: Every Day    Packs/day: .5    Types: Cigarettes   Smokeless tobacco: Never  Vaping Use   Vaping Use: Never used  Substance Use Topics   Alcohol use: Not Currently    Comment: states 24 ounce can every 2 weeks   Drug use: Yes    Types: Marijuana     Medication list has been reviewed and updated.  Current Meds  Medication Sig   aspirin 81 MG chewable tablet Chew 81 mg by mouth daily.   atorvastatin (LIPITOR) 80 MG tablet Take 1 tablet by mouth daily.   lidocaine 4 % Place onto the skin.   lisinopril (ZESTRIL) 10 MG tablet Take 1 tablet by mouth daily.   nicotine (NICODERM CQ - DOSED IN MG/24 HOURS) 21 mg/24hr patch Place onto the skin.   nicotine polacrilex (COMMIT) 2 MG lozenge Place inside cheek.   nicotine polacrilex (NICORETTE) 4 MG gum Place inside cheek.       11/21/2021    2:43 PM  GAD 7 : Generalized Anxiety Score  Nervous, Anxious, on Edge 2  Control/stop worrying 3  Worry too much - different things 3  Trouble relaxing 3  Restless 2  Easily annoyed or irritable 2  Afraid - awful might happen 0  Total GAD 7 Score 15  Anxiety Difficulty Somewhat difficult       11/21/2021    2:42 PM  Depression screen PHQ 2/9  Decreased Interest 0  Down, Depressed, Hopeless 3  PHQ - 2 Score 3  Altered sleeping 3  Tired, decreased energy 2  Change in appetite 0  Feeling bad or failure about yourself  0  Trouble concentrating 1  Moving slowly or fidgety/restless 0  Suicidal thoughts 0  PHQ-9 Score 9  Difficult doing work/chores Somewhat difficult     BP Readings from Last 3 Encounters:  08/14/22 (!) 122/104  08/07/22 (!) 160/130  05/05/22 119/82    Physical Exam Vitals and nursing note reviewed. Exam conducted with a chaperone present.  Constitutional:      General: She is not in acute distress.    Appearance: She is not diaphoretic.  HENT:     Head: Normocephalic and atraumatic.     Right Ear: Tympanic membrane and external ear normal.     Left Ear: Tympanic membrane and external ear normal.     Nose: Nose normal.  Eyes:     General:        Right eye: No discharge.        Left eye: No discharge.     Conjunctiva/sclera: Conjunctivae normal.     Pupils: Pupils are equal, round, and reactive to light.  Neck:     Thyroid: No thyromegaly.     Vascular: No JVD.  Cardiovascular:     Rate and Rhythm: Normal rate and regular rhythm.  Heart sounds: Normal heart sounds. No murmur heard.    No friction rub. No gallop.  Pulmonary:     Effort: Pulmonary effort is normal.     Breath sounds: Normal breath sounds.  Abdominal:     General: Bowel sounds are normal.     Palpations: Abdomen is soft. There is no mass.     Tenderness: There is no abdominal tenderness. There is no guarding.  Musculoskeletal:        General: Normal range of motion.     Cervical back: Normal range of motion and neck supple.  Lymphadenopathy:     Cervical: No cervical adenopathy.  Skin:    General: Skin is warm and dry.  Neurological:     Mental Status: She is alert.     Deep Tendon Reflexes: Reflexes are normal and symmetric.     Wt Readings from Last 3 Encounters:  08/14/22 188 lb (85.3 kg)  08/07/22 163 lb (73.9 kg)  05/05/22 181 lb (82.1 kg)    BP (!) 122/104   Pulse 86   Ht 5\' 11"  (1.803 m)   Wt 188 lb (85.3 kg)   LMP 08/16/2016   SpO2 97%   BMI 26.22 kg/m  [facility] [place] [facility] AT [facility]                                   Transitional Care Clinic   [facility] Discharge Acute Issues Care Follow Up                                                                        Patient Demographics  STELLAROSE YAPP, is a 52 y.o. female  DOB July 30, 1970  MRN [id number].  Primary MD  Duanne Limerick, MD   Reason for TCC follow Up -specifically patient is to be followed up for hypertension control which we will adjust the with current lisinopril and addition of hydrochlorothiazide and assuring that we have plenty of lipid lowering agent atorvastatin 80 mg.   Past Medical History:  Diagnosis Date   Alcoholism    Arthritis    Hypertension    Lupus     Past Surgical History:  Procedure Laterality Date   FOREIGN BODY REMOVAL  2015   BULLET REMOVED RIGHT CHEST   HERNIA REPAIR     UMBILICAL   NO PAST SURGERIES     TOTAL KNEE ARTHROPLASTY Left 11/22/2020   Procedure: TOTAL KNEE ARTHROPLASTY;  Surgeon: Christena Flake, MD;  Location: [facility] ORS;  Service: Orthopedics;  Laterality: Left;   Recent HPI and Hospital course patient was admitted with a CVA involving the right basal ganglia..  Patient has improved over the course of the last 7 to 10 days with increased strength on the right side which she did not have prior to discharge to hospital.  Patient has been told the importance of controlling blood pressure, importance of controlling lipids, and absolute cessation of smoking. Post Hospital acute care issue to be followed in this clinic is to review patient's blood pressure and to adjust medication accordingly to which I have  added hydrochlorothiazide to her lisinopril.  We will recheck her lipid panel upon return in 4 weeks as well as blood pressure on the newly added medication.      Subjective:   Linus Mako today has, No headache, No chest pain, No abdominal pain - No Nausea, No new weakness tingling or numbness, No Cough - SOB.  Patient is experienced no headaches no progression of muscular weakness or progression of sensory deficit.   Neurologic exam is improving with minimal residual facial asymmetry.  Assessment & Plan    1. Primary hypertension Chronic.  Controlled.  Stable.  Blood pressure 122/104.  This is not optimal and we will add hydrochlorothiazide 12.5 mg to current dosing of lisinopril 10 mg once a day.  Will recheck in 4 weeks. - hydrochlorothiazide (HYDRODIURIL) 12.5 MG tablet; Take 1 tablet (12.5 mg total) by mouth daily.  Dispense: 90 tablet; Refill: 0 - lisinopril (ZESTRIL) 10 MG tablet; Take 1 tablet (10 mg total) by mouth daily.  Dispense: 90 tablet; Refill: 0  2. Familial hypercholesterolemia Chronic.  Persistent.  Recently placed on atorvastatin by [facility] for control of cholesterol a atorvastatin 80 mg once a day.  Will check lipid panel at next visit. - atorvastatin (LIPITOR) 80 MG tablet; Take 1 tablet (80 mg total) by mouth daily.  Dispense: 90 tablet; Refill: 0  3. Cerebral vascular disease Patient is follow-up of cerebral vascular accident involving the right basal ganglia.  Patient has some residual weakness but not to the extent that when we initially evaluated her earlier in the week.  4. Cigarette nicotine dependence without complication Patient has been advised of the health risks of smoking and counseled concerning cessation of tobacco products. I spent over 3 minutes for discussion and to answer questions.     Reason for frequent admissions/ER visits **      Objective:   Vitals:   08/14/22 1437  BP: (!) 122/104  Pulse: 86  SpO2: 97%  Weight: 188 lb (85.3 kg)  Height: 5\' 11"  (1.803 m)    Wt Readings from Last 3 Encounters:  08/14/22 188 lb (85.3 kg)  08/07/22 163 lb (73.9 kg)  05/05/22 181 lb (82.1 kg)    Allergies as of 08/14/2022   No Known Allergies      Medication List        Accurate as of August 14, 2022  3:06 PM. If you have any questions, ask your nurse or doctor.          STOP taking these medications    Blue-Emu Super Strength Crea Stopped by: Elizabeth Sauer, MD   chlorthalidone 25 MG tablet Commonly known as: HYGROTON Stopped by: Elizabeth Sauer, MD   ICY HOT EX Stopped by: Elizabeth Sauer, MD   ondansetron 4 MG tablet Commonly known as: ZOFRAN Stopped by: Elizabeth Sauer, MD       TAKE these medications    apixaban 2.5 MG Tabs tablet Commonly known as: ELIQUIS Take 1 tablet (2.5 mg total) by mouth 2 (two) times daily.   aspirin 81 MG chewable tablet Chew 81 mg by mouth daily.   atorvastatin 80 MG tablet Commonly known as: LIPITOR Take 1 tablet (80 mg total) by mouth daily.   hydrochlorothiazide 12.5 MG tablet Commonly known as: HYDRODIURIL Take 1 tablet (12.5 mg total) by mouth daily. Started by: Elizabeth Sauer, MD   lidocaine 4 % Place onto the skin.   lisinopril 10 MG tablet Commonly known as: ZESTRIL Take 1 tablet (10 mg total)  by mouth daily.   nicotine 21 mg/24hr patch Commonly known as: NICODERM CQ - dosed in mg/24 hours Place onto the skin.   nicotine polacrilex 2 MG lozenge Commonly known as: COMMIT Place inside cheek.   nicotine polacrilex 4 MG gum Commonly known as: NICORETTE Place inside cheek.         Physical Exam: Constitutional: Patient appears well-developed and well-nourished. Not in obvious distress. HENT: Normocephalic, atraumatic, External right and left ear normal. Oropharynx is clear and moist.  Eyes: Conjunctivae and EOM are normal. PERRLA, no scleral icterus. Neck: Normal ROM. Neck supple. No JVD. No tracheal deviation. No thyromegaly. CVS: RRR, S1/S2 +, no murmurs, no gallops, no carotid bruit.  Pulmonary: Effort and breath sounds normal, no stridor, rhonchi, wheezes, rales.  Abdominal: Soft. BS +, no distension, tenderness, rebound or guarding.  Musculoskeletal: Normal range of motion. No edema and no tenderness.  Lymphadenopathy: No lymphadenopathy noted, cervical, inguinal or axillary Neuro: Alert. Normal reflexes, muscle tone coordination. No cranial nerve deficit. Skin: Skin  is warm and dry. No rash noted. Not diaphoretic. No erythema. No pallor. Psychiatric: Normal mood and affect. Behavior, judgment, thought content normal.   Data Review   Micro Results No results found for this or any previous visit (from the past 240 hour(s)).   CBC No results for input(s): "WBC", "HGB", "HCT", "PLT", "MCV", "MCH", "MCHC", "RDW", "LYMPHSABS", "MONOABS", "EOSABS", "BASOSABS", "BANDABS" in the last 168 hours.  Invalid input(s): "NEUTRABS", "BANDSABD"  Chemistries  No results for input(s): "NA", "K", "CL", "CO2", "GLUCOSE", "BUN", "CREATININE", "CALCIUM", "MG", "AST", "ALT", "ALKPHOS", "BILITOT" in the last 168 hours.  Invalid input(s): "GFRCGP" ------------------------------------------------------------------------------------------------------------------ CrCl cannot be calculated (Patient's most recent lab result is older than the maximum 21 days allowed.). ------------------------------------------------------------------------------------------------------------------ No results for input(s): "HGBA1C" in the last 72 hours. ------------------------------------------------------------------------------------------------------------------ No results for input(s): "CHOL", "HDL", "LDLCALC", "TRIG", "CHOLHDL", "LDLDIRECT" in the last 72 hours. ------------------------------------------------------------------------------------------------------------------ No results for input(s): "TSH", "T4TOTAL", "T3FREE", "THYROIDAB" in the last 72 hours.  Invalid input(s): "FREET3" ------------------------------------------------------------------------------------------------------------------ No results for input(s): "VITAMINB12", "FOLATE", "FERRITIN", "TIBC", "IRON", "RETICCTPCT" in the last 72 hours.  Coagulation profile No results for input(s): "INR", "PROTIME" in the last 168 hours.  No results for input(s): "DDIMER" in the last 72 hours.  Cardiac Enzymes No results for  input(s): "CKMB", "TROPONINI", "MYOGLOBIN" in the last 168 hours.  Invalid input(s): "CK" ------------------------------------------------------------------------------------------------------------------ Invalid input(s): "POCBNP" Time spent in minutes 45.     Elizabeth Sauer M.D on 08/14/2022 at 3:06 PM   Disclaimer: This note may have been dictated with voice recognition software. Similar sounding words can inadvertently be transcribed and this note may contain transcription errors which may not have been corrected upon publication of note.        Elizabeth Sauer, MD

## 2022-08-15 ENCOUNTER — Encounter: Payer: Self-pay | Admitting: Family Medicine

## 2022-08-17 ENCOUNTER — Telehealth: Payer: Self-pay

## 2022-08-17 NOTE — Telephone Encounter (Signed)
Called Doretha this morning- Pt's supervisor. I explained that the pt is wanting a note to return to work, but we did not take her out of work. I went over the fact that we have seen her in office and she appeared "ok to work", but we did not take her out. 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 She will reach out to patient and let her know to get in touch with the doctor who put her out of work.

## 2022-09-11 ENCOUNTER — Ambulatory Visit (INDEPENDENT_AMBULATORY_CARE_PROVIDER_SITE_OTHER): Payer: Medicaid Other | Admitting: Family Medicine

## 2022-09-11 ENCOUNTER — Encounter: Payer: Self-pay | Admitting: Family Medicine

## 2022-09-11 VITALS — BP 120/80 | HR 98 | Ht 71.0 in | Wt 182.0 lb

## 2022-09-11 DIAGNOSIS — I10 Essential (primary) hypertension: Secondary | ICD-10-CM | POA: Diagnosis not present

## 2022-09-11 DIAGNOSIS — F17210 Nicotine dependence, cigarettes, uncomplicated: Secondary | ICD-10-CM | POA: Diagnosis not present

## 2022-09-11 DIAGNOSIS — E7801 Familial hypercholesterolemia: Secondary | ICD-10-CM | POA: Diagnosis not present

## 2022-09-11 DIAGNOSIS — I679 Cerebrovascular disease, unspecified: Secondary | ICD-10-CM | POA: Diagnosis not present

## 2022-09-11 MED ORDER — HYDROCHLOROTHIAZIDE 12.5 MG PO TABS
12.5000 mg | ORAL_TABLET | Freq: Every day | ORAL | 1 refills | Status: AC
Start: 2022-09-11 — End: ?

## 2022-09-11 MED ORDER — ATORVASTATIN CALCIUM 80 MG PO TABS: 80.0000 mg | ORAL_TABLET | Freq: Every day | ORAL | 1 refills | Status: AC

## 2022-09-11 MED ORDER — LISINOPRIL 10 MG PO TABS: 10.0000 mg | ORAL_TABLET | Freq: Every day | ORAL | 1 refills | Status: DC

## 2022-09-11 NOTE — Progress Notes (Signed)
Date:  09/11/2022   Name:  Lindsay Price   DOB:  1971-02-22   MRN:  [id number]   Chief Complaint: Hypertension  Hypertension This is a chronic problem. The current episode started more than 1 year ago. The problem has been gradually improving since onset. The problem is controlled. Pertinent negatives include no chest pain, headaches, palpitations or shortness of breath. There are no associated agents to hypertension. Risk factors for coronary artery disease include dyslipidemia and smoking/tobacco exposure. Past treatments include ACE inhibitors and diuretics. The current treatment provides moderate improvement. There are no compliance problems.  Hypertensive end-organ damage includes CVA. There is no history of chronic renal disease, a hypertension causing med or renovascular disease.  Hyperlipidemia This is a chronic problem. The current episode started more than 1 year ago. The problem is controlled. Recent lipid tests were reviewed and are normal. She has no history of chronic renal disease or diabetes. Pertinent negatives include no chest pain, focal sensory loss or shortness of breath. Current antihyperlipidemic treatment includes statins. The current treatment provides moderate improvement of lipids. There are no compliance problems.  Risk factors for coronary artery disease include dyslipidemia and hypertension.    Lab Results  Component Value Date   NA 143 05/05/2022   K 3.7 05/05/2022   CO2 22 05/05/2022   GLUCOSE 103 (H) 05/05/2022   BUN 6 05/05/2022   CREATININE 0.59 05/05/2022   CALCIUM 8.8 (L) 05/05/2022   GFRNONAA >60 05/05/2022   No results found for: "CHOL", "HDL", "LDLCALC", "LDLDIRECT", "TRIG", "CHOLHDL" Lab Results  Component Value Date   TSH 1.542 05/04/2020   Lab Results  Component Value Date   HGBA1C 4.9 05/04/2020   Lab Results  Component Value Date   WBC 5.4 05/05/2022   HGB 13.9 05/05/2022   HCT 41.0 05/05/2022   MCV 106.8 (H) 05/05/2022   PLT 231  05/05/2022   Lab Results  Component Value Date   ALT 12 05/05/2022   AST 22 05/05/2022   ALKPHOS 62 05/05/2022   BILITOT 0.4 05/05/2022   No results found for: "25OHVITD2", "25OHVITD3", "VD25OH"   Review of Systems  HENT:  Negative for congestion, sinus pressure, sneezing and trouble swallowing.   Eyes:  Negative for visual disturbance.  Respiratory:  Negative for cough, choking, chest tightness, shortness of breath and wheezing.   Cardiovascular:  Negative for chest pain and palpitations.  Gastrointestinal:  Negative for abdominal pain and constipation.  Neurological:  Negative for headaches.    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   Fall    Rib fracture 05/04/2020   Amitriptyline overdose of undetermined intent 11/13/2017   Alcohol abuse 11/13/2017   Substance induced mood disorder (HCC) 11/13/2017    No Known Allergies  Past Surgical History:  Procedure Laterality Date   FOREIGN BODY REMOVAL  2015   BULLET REMOVED RIGHT CHEST   HERNIA REPAIR     UMBILICAL   NO PAST SURGERIES     TOTAL KNEE ARTHROPLASTY Left 11/22/2020   Procedure: TOTAL KNEE ARTHROPLASTY;  Surgeon: Christena Flake, MD;  Location: [facility] ORS;  Service: Orthopedics;  Laterality: Left;    Social History   Tobacco Use   Smoking status: Every Day    Packs/day: .5    Types: Cigarettes   Smokeless tobacco: Never  Vaping Use   Vaping Use: Never used  Substance Use Topics   Alcohol use: Not  Currently    Comment: states 24 ounce can every 2 weeks   Drug use: Yes    Types: Marijuana     Medication list has been reviewed and updated.  Current Meds  Medication Sig   aspirin EC 81 MG tablet Take 81 mg by mouth daily. Swallow whole.   atorvastatin (LIPITOR) 80 MG tablet Take 1 tablet (80 mg total) by mouth daily.   hydrochlorothiazide (HYDRODIURIL) 12.5 MG tablet Take 1 tablet (12.5 mg total) by mouth daily.   lidocaine  4 % Place onto the skin.   lisinopril (ZESTRIL) 10 MG tablet Take 1 tablet (10 mg total) by mouth daily.       09/11/2022    2:57 PM 11/21/2021    2:43 PM  GAD 7 : Generalized Anxiety Score  Nervous, Anxious, on Edge 0 2  Control/stop worrying 0 3  Worry too much - different things 0 3  Trouble relaxing 0 3  Restless 0 2  Easily annoyed or irritable 0 2  Afraid - awful might happen 0 0  Total GAD 7 Score 0 15  Anxiety Difficulty Not difficult at all Somewhat difficult       09/11/2022    2:56 PM 11/21/2021    2:42 PM  Depression screen PHQ 2/9  Decreased Interest 0 0  Down, Depressed, Hopeless 0 3  PHQ - 2 Score 0 3  Altered sleeping 0 3  Tired, decreased energy 0 2  Change in appetite 0 0  Feeling bad or failure about yourself  0 0  Trouble concentrating 0 1  Moving slowly or fidgety/restless 0 0  Suicidal thoughts 0 0  PHQ-9 Score 0 9  Difficult doing work/chores Not difficult at all Somewhat difficult    BP Readings from Last 3 Encounters:  09/11/22 120/80  08/14/22 (!) 122/104  08/07/22 (!) 160/130    Physical Exam Vitals and nursing note reviewed. Exam conducted with a chaperone present.  Constitutional:      General: She is not in acute distress.    Appearance: She is not diaphoretic.  HENT:     Head: Normocephalic and atraumatic.     Right Ear: Tympanic membrane and external ear normal.     Left Ear: Tympanic membrane and external ear normal.     Nose: Nose normal.     Mouth/Throat:     Mouth: Mucous membranes are moist.  Eyes:     General:        Right eye: No discharge.        Left eye: No discharge.     Conjunctiva/sclera: Conjunctivae normal.     Pupils: Pupils are equal, round, and reactive to light.  Neck:     Thyroid: No thyromegaly.     Vascular: No JVD.  Cardiovascular:     Rate and Rhythm: Normal rate and regular rhythm.     Heart sounds: Normal heart sounds, S1 normal and S2 normal. No murmur heard.    No systolic murmur is present.      No diastolic murmur is present.     No friction rub. No gallop. No S3 or S4 sounds.  Pulmonary:     Effort: Pulmonary effort is normal. No respiratory distress.     Breath sounds: Normal breath sounds. No stridor. No wheezing, rhonchi or rales.  Abdominal:     General: Bowel sounds are normal.     Palpations: Abdomen is soft. There is no mass.     Tenderness: There is no  abdominal tenderness. There is no guarding.  Musculoskeletal:        General: Normal range of motion.     Cervical back: Normal range of motion and neck supple.  Lymphadenopathy:     Cervical: No cervical adenopathy.  Skin:    General: Skin is warm and dry.  Neurological:     Mental Status: She is alert.     Wt Readings from Last 3 Encounters:  09/11/22 182 lb (82.6 kg)  08/14/22 188 lb (85.3 kg)  08/07/22 163 lb (73.9 kg)    BP 120/80   Pulse 98   Ht 5\' 11"  (1.803 m)   Wt 182 lb (82.6 kg)   LMP 08/16/2016   SpO2 96%   BMI 25.38 kg/m   Assessment and Plan: 1. Primary hypertension Chronic.  Controlled.  Stable.  Blood pressure today 120/80.  Asymptomatic.  Tolerating medications well.  Will continue hydrochlorothiazide 12.5 mg once a day and lisinopril 10 mg once a day.  Review of previous potassium and renal panel is acceptable and will recheck in 4 months. - hydrochlorothiazide (HYDRODIURIL) 12.5 MG tablet; Take 1 tablet (12.5 mg total) by mouth daily.  Dispense: 90 tablet; Refill: 1 - lisinopril (ZESTRIL) 10 MG tablet; Take 1 tablet (10 mg total) by mouth daily.  Dispense: 90 tablet; Refill: 1  2. Familial hypercholesterolemia .  Controlled.  Stable.  Review of lipid panel last month was acceptable.  Will continue atorvastatin 80 mg once a day as well as surveillance of lipid management by diet as well. - atorvastatin (LIPITOR) 80 MG tablet; Take 1 tablet (80 mg total) by mouth daily.  Dispense: 90 tablet; Refill: 1  3. Cigarette nicotine dependence without complication Patient has been advised of  the health risks of smoking and counseled concerning cessation of tobacco products. I spent over 3 minutes for discussion and to answer questions.  Specifically told her that she cannot smoke a cigarette to have 1 last "hit" prior to seeing me next visit  4. Cerebral vascular disease Patient has not taken her Eliquis and by neurology she was instructed to take 81 mg aspirin instead.    Elizabeth Sauer, MD

## 2022-09-12 ENCOUNTER — Emergency Department: Payer: Medicaid Other

## 2022-09-12 ENCOUNTER — Other Ambulatory Visit: Payer: Self-pay

## 2022-09-12 DIAGNOSIS — M25562 Pain in left knee: Secondary | ICD-10-CM | POA: Diagnosis present

## 2022-09-12 DIAGNOSIS — Z96652 Presence of left artificial knee joint: Secondary | ICD-10-CM | POA: Insufficient documentation

## 2022-09-12 DIAGNOSIS — M25462 Effusion, left knee: Secondary | ICD-10-CM | POA: Diagnosis not present

## 2022-09-12 DIAGNOSIS — I10 Essential (primary) hypertension: Secondary | ICD-10-CM | POA: Diagnosis not present
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
## 2022-09-13 ENCOUNTER — Telehealth: Payer: Self-pay

## 2022-09-13 ENCOUNTER — Emergency Department
Admission: EM | Admit: 2022-09-13 | Discharge: 2022-09-13 | Disposition: A | Discharge status: Home / Self Care | Payer: Medicaid Other | Attending: Emergency Medicine | Admitting: Emergency Medicine

## 2022-09-13 DIAGNOSIS — M25562 Pain in left knee: Secondary | ICD-10-CM

## 2022-09-13 NOTE — Transitions of Care (Post Inpatient/ED Visit) (Signed)
   09/13/2022  Name: Lindsay Price MRN: [id number] DOB: 1971/02/04  Today's TOC FU Call Status: Unsuccessful Call (1st Attempt) Date: 09/13/22  Attempted to reach the patient regarding the most recent Inpatient/ED visit.  Follow Up Plan: Additional outreach attempts will be made to reach the patient to complete the Transitions of Care (Post Inpatient/ED visit) call.   Signature Arthur Holms
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
## 2022-09-14 ENCOUNTER — Telehealth: Payer: Self-pay

## 2022-09-14 NOTE — Transitions of Care (Post Inpatient/ED Visit) (Signed)
   09/14/2022  Name: Lindsay Price MRN: [id number] DOB: 08-25-1970  Today's TOC FU Call Status: Today's TOC FU Call Status:: Unsuccessful Call (2nd Attempt) Unsuccessful Call (2nd Attempt) Date: 09/14/22  Attempted to reach the patient regarding the most recent Inpatient/ED visit.  Follow Up Plan: Additional outreach attempts will be made to reach the patient to complete the Transitions of Care (Post Inpatient/ED visit) call.   Signature  Arthur Holms

## 2022-09-18 NOTE — Transitions of Care (Post Inpatient/ED Visit) (Signed)
   09/18/2022  Name: ROSELA DOMINA MRN: [id number] DOB: 09/15/1970  Today's TOC FU Call Status:    Attempted to reach the patient regarding the most recent Inpatient/ED visit.  Follow Up Plan: No further outreach attempts will be made at this time. We have been unable to contact the patient.  Signature Karena Addison, LPN [facility] Nurse Health Advisor Direct Dial [phone]

## 2023-01-01 IMAGING — CR DG KNEE 1-2V*L*
1 series · 2 of 2 positions shown · non-contrast
Comparison: November 22, 2020.

CLINICAL DATA: Leg swelling after knee replacement.

EXAM:
LEFT KNEE - 1-2 VIEW

[Series 1: dg knee complete 4 views left · 0.14mm/px · 2 of 2 slices shown]
[im 1/2]
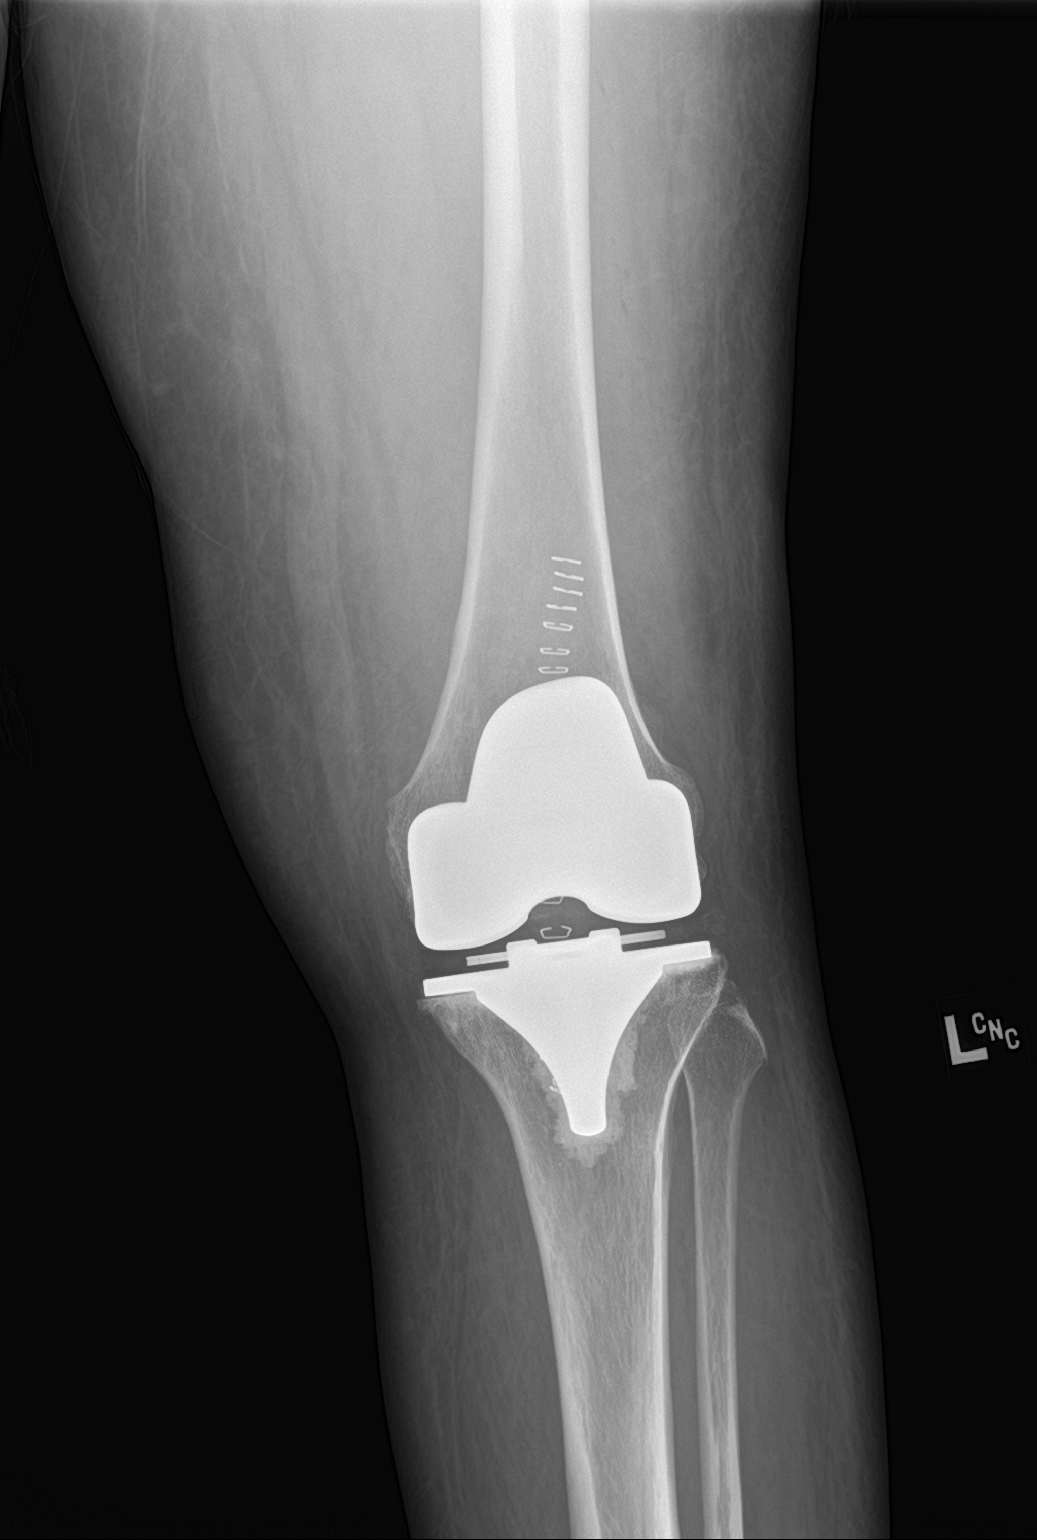
[im 2/2]
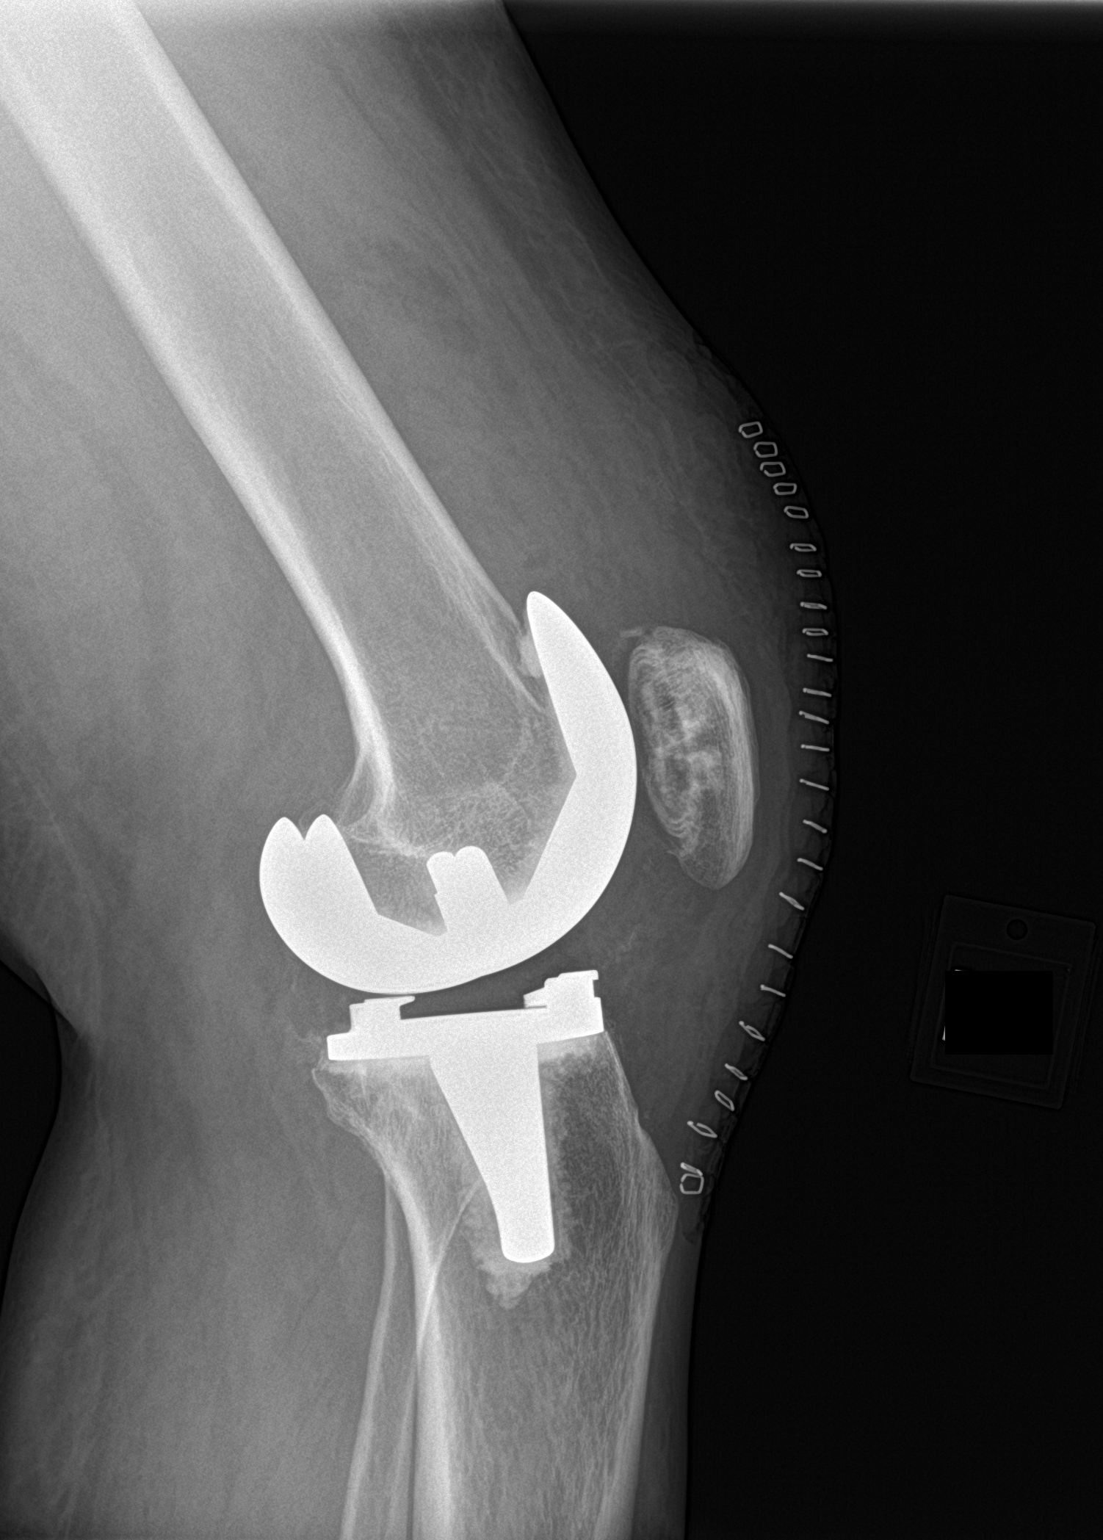

[2 of 2 positions shown; findings below may reference images not displayed]

FINDINGS: The left femoral and tibial components appear to be well situated.
No fracture or dislocation is noted. Soft tissue swelling is noted
anteriorly which is expected postoperative finding.
IMPRESSION: Status post left total knee arthroplasty.

## 2023-01-12 ENCOUNTER — Ambulatory Visit
Admission: EM | Admit: 2023-01-12 | Discharge: 2023-01-12 | Disposition: A | Discharge status: Home / Self Care | Payer: MEDICAID | Attending: Family Medicine | Admitting: Family Medicine

## 2023-01-12 ENCOUNTER — Encounter: Payer: Self-pay | Admitting: Family Medicine

## 2023-01-12 DIAGNOSIS — R079 Chest pain, unspecified: Secondary | ICD-10-CM

## 2023-01-12 NOTE — ED Triage Notes (Signed)
Patient reports chest pain and SOB that started last night.  Patient has history of 2 CVAs. Patient denies history of MI.  Patient denies numbness or tingling.  Patient denies N/V.

## 2023-01-12 NOTE — ED Notes (Signed)
Patient is being discharged from the Urgent Care and sent to the [facility] Emergency Department via private vehicle . Per Dr. Rachael Darby, patient is in need of higher level of care due to chest pain. Patient is aware and verbalizes understanding of plan of care.  Vitals:   01/12/23 1015  BP: (!) 142/101  Pulse: 90  Resp: 14  Temp: 98.2 F (36.8 C)  SpO2: 98%

## 2023-01-12 NOTE — Discharge Instructions (Addendum)
You have been advised to follow up immediately in the emergency department for concerning signs or symptoms as discussed during your visit. If you declined EMS transport, please have a family member take you directly to the ED at this time. Do not delay.

## 2023-01-12 NOTE — ED Provider Notes (Incomplete)
[facility]    CSN: [id number] Arrival date & time: 01/12/23  1007      History   Chief Complaint Chief Complaint  Patient presents with   Chest Pain    HPI Lindsay Price is a 52 y.o. female.   HPI  Bailei here for chest pain substernal to left sided chest sided that started last night. Reports some shortness of breath that gets worse with walking around.  Nothing chest pain worse. Nothing makes pain better. Has similar pain when she had a stroke in the past. Pt has not been taking her Lipitor.  Takes her blood pressure medication. Pt does not take ASA daily.   Treatments tried: nothing   Does not follow with cardiology.    Associated Symptoms: slight cough.   Patient Denies: Nausea, Vomiting, Diaphoresis, Pleuritic pain, Leg swelling, Syncope, Heartburn/food sticking, Recent immobility, Wheezing, Hemoptysis, Trauma, and Fever    History of PE, DVT, DM, cancer, recent surgery   Tobacco use:  yes, cigarette 1 ppd     Past Medical History:  Diagnosis Date   Alcoholism (HCC)    Arthritis    Hypertension    Lupus (HCC)     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   Fall    Rib fracture 05/04/2020   Amitriptyline overdose of undetermined intent 11/13/2017   Alcohol abuse 11/13/2017   Substance induced mood disorder (HCC) 11/13/2017    Past Surgical History:  Procedure Laterality Date   FOREIGN BODY REMOVAL  2015   BULLET REMOVED RIGHT CHEST   HERNIA REPAIR     UMBILICAL   NO PAST SURGERIES     TOTAL KNEE ARTHROPLASTY Left 11/22/2020   Procedure: TOTAL KNEE ARTHROPLASTY;  Surgeon: Christena Flake, MD;  Location: [facility] ORS;  Service: Orthopedics;  Laterality: Left;    OB History   No obstetric history on file.      Home Medications    Prior to Admission medications   Medication Sig Start Date End Date Taking? Authorizing Provider  hydrochlorothiazide (HYDRODIURIL)  12.5 MG tablet Take 1 tablet (12.5 mg total) by mouth daily. 09/11/22  Yes Duanne Limerick, MD  lisinopril (ZESTRIL) 10 MG tablet Take 1 tablet (10 mg total) by mouth daily. 09/11/22  Yes Duanne Limerick, MD  apixaban (ELIQUIS) 2.5 MG TABS tablet Take 1 tablet (2.5 mg total) by mouth 2 (two) times daily. Patient not taking: Reported on 11/21/2021 11/23/20   Anson Oregon, PA-C  aspirin EC 81 MG tablet Take 81 mg by mouth daily. Swallow whole.    [provider]  atorvastatin (LIPITOR) 80 MG tablet Take 1 tablet (80 mg total) by mouth daily. 09/11/22 12/10/22  Duanne Limerick, MD  lidocaine 4 % Place onto the skin. 08/08/22   [provider]  nicotine (NICODERM CQ - DOSED IN MG/24 HOURS) 21 mg/24hr patch Place onto the skin. Patient not taking: Reported on 09/11/2022 08/09/22   [provider]  amLODipine (NORVASC) 10 MG tablet Take 1 tablet (10 mg total) by mouth daily. 05/05/20 09/19/20  Enedina Finner, MD    Family History History reviewed. No pertinent family history.  Social History Social History   Tobacco Use   Smoking status: Every Day    Current packs/day: 0.50    Types: Cigarettes   Smokeless tobacco: Never  Vaping Use   Vaping status: Never Used  Substance Use Topics  Alcohol use: Not Currently    Comment: states 24 ounce can every 2 weeks   Drug use: Yes    Types: Marijuana     Allergies   Patient has no known allergies.   Review of Systems Review of Systems :negative unless otherwise stated in HPI.      Physical Exam Triage Vital Signs ED Triage Vitals  Encounter Vitals Group     BP      Systolic BP Percentile      Diastolic BP Percentile      Pulse      Resp      Temp      Temp src      SpO2      Weight      Height      Head Circumference      Peak Flow      Pain Score      Pain Loc      Pain Education      Exclude from Growth Chart    No data found.  Updated Vital Signs BP (!) 142/101 (BP Location: Left Arm)   Pulse 90    Temp 98.2 F (36.8 C) (Oral)   Resp 14   Ht 5\' 11"  (1.803 m)   Wt 82.6 kg   LMP 08/16/2016   SpO2 98%   BMI 25.40 kg/m   Visual Acuity Right Eye Distance:   Left Eye Distance:   Bilateral Distance:    Right Eye Near:   Left Eye Near:    Bilateral Near:     Physical Exam  GEN: Older female, in no acute distress CV: regular rate and rhythm,  no murmurs, rubs or gallops  appreciated,, no JVP  CHEST WALL: No chest wall tenderness RESP: no increased work of breathing, clear to ascultation bilaterally ABD: Bowel sounds present. Soft, non-tender, non-distended.  SKIN: warm, dry,  UC Treatments / Results  Labs (all labs ordered are listed, but only abnormal results are displayed) Labs Reviewed - No data to display  EKG  See my EKG interpretation in the MDM section  Radiology No results found.   Procedures Procedures (including critical care time)  Medications Ordered in UC Medications - No data to display  Initial Impression / Assessment and Plan / UC Course  I have reviewed the triage vital signs and the nursing notes.  Pertinent labs & imaging results that were available during my care of the patient were reviewed by me and considered in my medical decision making (see chart for details).       Patient is a 52 y.o. female with history of CVA, MI, alcohol abuse, cigarette use, hypertension, hyperlipidemia who presents with left-sided chest pain with intermittent shortness of breath since last night.  Differential diagnosis includes, but is not limited to, ACS, aortic dissection, pulmonary embolism, cardiac tamponade, pneumothorax, pneumonia, pericarditis/myocarditis, GI-related causes including esophagitis/gastritis, and musculoskeletal chest wall pain   EKG obtained and personally reviewed by me showing normal sinus rhythm without ST or T wave changes (photo under media).  Questionable septal infarct is not new compared to October 19, 2020 and Sep 24, 2019.  She  reports similar pain when she had a stroke before.  She is too high rest not to have serial troponins recommended ED evaluation and she is agreeable.  EMS transport declined.  Patient will travel to [facility] emergency department for chest pain evaluation.  Called [facility] ED and spoke with triage nurse who will await patient's arrival.  Discussed MDM, treatment plan and plan for follow-up with patient who agrees with plan.        Final Clinical Impressions(s) / UC Diagnoses   Final diagnoses:  Chest pain, unspecified type     Discharge Instructions      You have been advised to follow up immediately in the emergency department for concerning signs or symptoms as discussed during your visit. If you declined EMS transport, please have a family member take you directly to the ED at this time. Do not delay.      ED Prescriptions   None    PDMP not reviewed this encounter.      Katha Cabal, DO 01/15/23 [phone]

## 2023-01-21 ENCOUNTER — Ambulatory Visit: Payer: Medicaid Other | Admitting: Family Medicine

## 2023-04-06 ENCOUNTER — Other Ambulatory Visit: Payer: Self-pay

## 2023-04-06 ENCOUNTER — Encounter: Payer: Self-pay | Admitting: Emergency Medicine

## 2023-04-06 ENCOUNTER — Emergency Department
Admission: EM | Admit: 2023-04-06 | Discharge: 2023-04-07 | Disposition: A | Discharge status: Home / Self Care | Payer: 59 | Attending: Emergency Medicine | Admitting: Emergency Medicine

## 2023-04-06 ENCOUNTER — Emergency Department: Payer: 59

## 2023-04-06 DIAGNOSIS — Y908 Blood alcohol level of 240 mg/100 ml or more: Secondary | ICD-10-CM | POA: Insufficient documentation

## 2023-04-06 DIAGNOSIS — F10929 Alcohol use, unspecified with intoxication, unspecified: Secondary | ICD-10-CM

## 2023-04-06 DIAGNOSIS — R569 Unspecified convulsions: Secondary | ICD-10-CM | POA: Insufficient documentation

## 2023-04-06 DIAGNOSIS — R531 Weakness: Secondary | ICD-10-CM | POA: Diagnosis present

## 2023-04-06 DIAGNOSIS — F10920 Alcohol use, unspecified with intoxication, uncomplicated: Secondary | ICD-10-CM | POA: Diagnosis not present

## 2023-04-06 DIAGNOSIS — E876 Hypokalemia: Secondary | ICD-10-CM | POA: Diagnosis not present

## 2023-04-06 LAB — CBC WITH DIFFERENTIAL/PLATELET
Abs Immature Granulocytes: 0.02 10*3/uL (ref 0.00–0.07)
Basophils Absolute: 0 10*3/uL (ref 0.0–0.1)
Basophils Relative: 0 %
Eosinophils Absolute: 0 10*3/uL (ref 0.0–0.5)
Eosinophils Relative: 0 %
HCT: 41 % (ref 36.0–46.0)
Hemoglobin: 14.1 g/dL (ref 12.0–15.0)
Immature Granulocytes: 0 %
Lymphocytes Relative: 67 %
Lymphs Abs: 4.6 10*3/uL — ABNORMAL HIGH (ref 0.7–4.0)
MCH: 36.2 pg — ABNORMAL HIGH (ref 26.0–34.0)
MCHC: 34.4 g/dL (ref 30.0–36.0)
MCV: 105.4 fL — ABNORMAL HIGH (ref 80.0–100.0)
Monocytes Absolute: 0.4 10*3/uL (ref 0.1–1.0)
Monocytes Relative: 6 %
Neutro Abs: 1.9 10*3/uL (ref 1.7–7.7)
Neutrophils Relative %: 27 %
Platelets: 298 10*3/uL (ref 150–400)
RBC: 3.89 MIL/uL (ref 3.87–5.11)
RDW: 13.4 % (ref 11.5–15.5)
WBC: 6.9 10*3/uL (ref 4.0–10.5)
nRBC: 0 % (ref 0.0–0.2)

## 2023-04-06 MED ORDER — LACTATED RINGERS IV BOLUS
1000.0000 mL | Freq: Once | INTRAVENOUS | Status: AC
  Administered 2023-04-06: 1000 mL via INTRAVENOUS

## 2023-04-06 MED ORDER — ONDANSETRON HCL 4 MG/2ML IJ SOLN
4.0000 mg | Freq: Once | INTRAMUSCULAR | Status: AC
Start: 2023-04-06 — End: 2023-04-06
  Administered 2023-04-06: 4 mg via INTRAVENOUS
  Filled 2023-04-06: qty 2

## 2023-04-06 NOTE — ED Provider Notes (Signed)
[facility] Provider Note    Event Date/Time   First MD Initiated Contact with Patient 04/06/23 2316     (approximate)   History   Seizures and Alcohol Intoxication   HPI  Lindsay Price is a 52 y.o. female who presents to the ED for evaluation of Seizures and Alcohol Intoxication   Review of neurology clinic visit from May.  History of ischemic stroke in April, mild left-sided weakness.  Patient presents to the ED via EMS from home, tearful, saying that she "cannot keep going through this."  She seems to be requesting help with stopping drinking ethanol.  She adamantly denies any suicidal intent or thoughts.  Reports tonight that she has been drinking "a few shots" without other recreational drugs.  Reports her left hand is tight, making a fist and she is having trouble releasing it.  No falls, trauma, syncopal episodes or recent illnesses.   Physical Exam   Triage Vital Signs: ED Triage Vitals  Encounter Vitals Group     BP --      Systolic BP Percentile --      Diastolic BP Percentile --      Pulse --      Resp --      Temp --      Temp Source 04/06/23 2315 Oral     SpO2 --      Weight --      Height --      Head Circumference --      Peak Flow --      Pain Score 04/06/23 2318 0     Pain Loc --      Pain Education --      Exclude from Growth Chart --     Most recent vital signs: Vitals:   04/06/23 2315 04/07/23 0115  BP: (!) 160/93 (!) 127/93  Pulse: 98 79  Resp: 18 18  Temp:  98.7 F (37.1 C)  SpO2: 100%     General: Awake, no distress.  Tearful but conversational, linear thought processes. CV:  Good peripheral perfusion.  Resp:  Normal effort.  Abd:  No distention.  MSK:  No deformity noted.  No signs of trauma Neuro:  Mild left-sided weakness is noted and she says this is chronic.  No signs of right-sided weakness or acute deficits Other:     ED Results / Procedures / Treatments   Labs (all labs ordered are  listed, but only abnormal results are displayed) Labs Reviewed  CBC WITH DIFFERENTIAL/PLATELET - Abnormal; Notable for the following components:      Result Value   MCV 105.4 (*)    MCH 36.2 (*)    Lymphs Abs 4.6 (*)    All other components within normal limits  ETHANOL - Abnormal; Notable for the following components:   Alcohol, Ethyl (B) 326 (*)    All other components within normal limits  COMPREHENSIVE METABOLIC PANEL - Abnormal; Notable for the following components:   Potassium 3.2 (*)    Glucose, Bld 113 (*)    Calcium 8.4 (*)    All other components within normal limits  MAGNESIUM    EKG   RADIOLOGY CT head interpreted by me without evidence of acute intracranial pathology  Official radiology report(s): CT HEAD WO CONTRAST ( )  Result Date: 04/07/2023 CLINICAL DATA:  Left-sided weakness, concern for seizures. EXAM: CT HEAD WITHOUT CONTRAST TECHNIQUE: Contiguous axial images were obtained from the base of the skull through the vertex  without intravenous contrast. RADIATION DOSE REDUCTION: This exam was performed according to the departmental dose-optimization program which includes automated exposure control, adjustment of the mA and/or kV according to patient size and/or use of iterative reconstruction technique. COMPARISON:  CT 05/04/2020 FINDINGS: Brain: Limited exam due to patient motion and nonstandard positioning. The brain is grossly normal in volume. No old territorial infarct is seen. Allowing for motion no obvious acute cortical infarct, hemorrhage, mass or mass effect are identified. The ventricles are normal in size and position. There are benign dural calcifications scattered along the falx. Vascular: No hyperdense vessel or unexpected calcification. Skull: Negative for fracture or focal lesions. Sinuses/Orbits: No acute finding. Other: None. IMPRESSION: Limited exam due to patient motion and nonstandard positioning. No obvious acute intracranial CT findings.  Follow-up as indicated. Electronically Signed   By: Almira Bar M.D.   On: 04/07/2023 00:37    PROCEDURES and INTERVENTIONS:  Procedures  Medications  lactated ringers bolus 1,000 mL (0 mLs Intravenous Stopped 04/07/23 0145)  ondansetron (ZOFRAN) injection 4 mg (4 mg Intravenous Given 04/06/23 2335)  potassium chloride SA (KLOR-CON M) CR tablet 40 mEq (40 mEq Oral Given 04/07/23 0016)     IMPRESSION / MDM / ASSESSMENT AND PLAN / ED COURSE  I reviewed the triage vital signs and the nursing notes.  Differential diagnosis includes, but is not limited to, acute hemorrhagic stroke, recrudescence of stroke symptoms in the setting of acute alcohol intoxication, electrolyte derangement, suicidal, polysubstance abuse  {Patient presents with symptoms of an acute illness or injury that is potentially life-threatening.  Patient presents acutely intoxicated with signs of recrudescence of her chronic stroke symptoms in the left, suitable for outpatient management.  No evidence of psychiatric emergency to require IVC or emergent psychiatric evaluation.  Mild hypokalemia was replaced orally.  Provide IV fluids and she has benign workup.  Normal CBC, CT head.  She sobers up and ambulates out of the ED in no acute distress with rehab resources.  Clinical Course as of 04/07/23 1610  Wynelle Link Apr 07, 2023  0039 reassessed [DS]    Clinical Course User Index [DS] Delton Prairie, MD     FINAL CLINICAL IMPRESSION(S) / ED DIAGNOSES   Final diagnoses:  Alcoholic intoxication with complication (HCC)  Hypokalemia     Rx / DC Orders   ED Discharge Orders     None        Note:  This document was prepared using Dragon voice recognition software and may include unintentional dictation errors.   Delton Prairie, MD 04/07/23 [phone]

## 2023-04-06 NOTE — ED Triage Notes (Signed)
Patient presents to ED via [facility] from home for possible seizures. Per EMS, pt called son and reported that she "felt like" she was [place] have a seizure,so son called EMS.   Hx of 2 strokes, seizures and ETOH. Does have HTN but doesn't take anything for it.   Pt does endorse at least 3 shots.

## 2023-04-07 DIAGNOSIS — E876 Hypokalemia: Secondary | ICD-10-CM | POA: Diagnosis not present

## 2023-04-07 LAB — COMPREHENSIVE METABOLIC PANEL
ALT: 14 U/L (ref 0–44)
AST: 22 U/L (ref 15–41)
Albumin: 4 g/dL (ref 3.5–5.0)
Alkaline Phosphatase: 64 U/L (ref 38–126)
Anion gap: 12 (ref 5–15)
BUN: 7 mg/dL (ref 6–20)
CO2: 22 mmol/L (ref 22–32)
Calcium: 8.4 mg/dL — ABNORMAL LOW (ref 8.9–10.3)
Chloride: 105 mmol/L (ref 98–111)
Creatinine, Ser: 0.64 mg/dL (ref 0.44–1.00)
GFR, Estimated: >60 mL/min (ref >60)
Glucose, Bld: 113 mg/dL — ABNORMAL HIGH (ref 70–99)
Potassium: 3.2 mmol/L — ABNORMAL LOW (ref 3.5–5.1)
Sodium: 139 mmol/L (ref 135–145)
Total Bilirubin: 0.4 mg/dL (ref <1.2)
Total Protein: 7.5 g/dL (ref 6.5–8.1)

## 2023-04-07 LAB — ETHANOL: Alcohol, Ethyl (B): 326 mg/dL (ref <10)

## 2023-04-07 LAB — MAGNESIUM: Magnesium: 2 mg/dL (ref 1.7–2.4)

## 2023-04-07 MED ORDER — POTASSIUM CHLORIDE CRYS ER 20 MEQ PO TBCR
40.0000 meq | EXTENDED_RELEASE_TABLET | Freq: Once | ORAL | Status: AC
  Administered 2023-04-07: 40 meq via ORAL
  Filled 2023-04-07: qty 2

## 2023-04-07 NOTE — ED Notes (Signed)
 Patient discharged from ED by provider. Discharge instructions reviewed with patient and all questions answered. Patient ambulatory from ED in NAD.

## 2024-05-12 ENCOUNTER — Emergency Department: Payer: MEDICAID

## 2024-05-12 ENCOUNTER — Ambulatory Visit
Admission: EM | Admit: 2024-05-12 | Discharge: 2024-05-12 | Disposition: A | Discharge status: Home / Self Care | Payer: MEDICAID

## 2024-05-12 ENCOUNTER — Encounter: Payer: Self-pay | Admitting: Emergency Medicine

## 2024-05-12 ENCOUNTER — Other Ambulatory Visit: Payer: Self-pay

## 2024-05-12 ENCOUNTER — Emergency Department
Admission: EM | Admit: 2024-05-12 | Discharge: 2024-05-12 | Disposition: A | Discharge status: Home / Self Care | Payer: MEDICAID | Attending: Emergency Medicine | Admitting: Emergency Medicine

## 2024-05-12 DIAGNOSIS — I639 Cerebral infarction, unspecified: Secondary | ICD-10-CM

## 2024-05-12 DIAGNOSIS — Y900 Blood alcohol level of less than 20 mg/100 ml: Secondary | ICD-10-CM | POA: Diagnosis not present

## 2024-05-12 DIAGNOSIS — M25512 Pain in left shoulder: Secondary | ICD-10-CM | POA: Diagnosis present

## 2024-05-12 DIAGNOSIS — R531 Weakness: Secondary | ICD-10-CM | POA: Diagnosis not present

## 2024-05-12 LAB — CBC
HCT: 40.8 % (ref 36.0–46.0)
Hemoglobin: 13.7 g/dL (ref 12.0–15.0)
MCH: 36.3 pg — ABNORMAL HIGH (ref 26.0–34.0)
MCHC: 33.6 g/dL (ref 30.0–36.0)
MCV: 108.2 fL — ABNORMAL HIGH (ref 80.0–100.0)
Platelets: 260 K/uL (ref 150–400)
RBC: 3.77 MIL/uL — ABNORMAL LOW (ref 3.87–5.11)
RDW: 13.2 % (ref 11.5–15.5)
WBC: 5.9 K/uL (ref 4.0–10.5)
nRBC: 0 % (ref 0.0–0.2)

## 2024-05-12 LAB — DIFFERENTIAL
Abs Immature Granulocytes: 0.01 K/uL (ref 0.00–0.07)
Basophils Absolute: 0 K/uL (ref 0.0–0.1)
Basophils Relative: 0 %
Eosinophils Absolute: 0 K/uL (ref 0.0–0.5)
Eosinophils Relative: 1 %
Immature Granulocytes: 0 %
Lymphocytes Relative: 42 %
Lymphs Abs: 2.5 K/uL (ref 0.7–4.0)
Monocytes Absolute: 0.5 K/uL (ref 0.1–1.0)
Monocytes Relative: 9 %
Neutro Abs: 2.9 K/uL (ref 1.7–7.7)
Neutrophils Relative %: 48 %

## 2024-05-12 LAB — COMPREHENSIVE METABOLIC PANEL WITH GFR
ALT: 10 U/L (ref 0–44)
AST: 19 U/L (ref 15–41)
Albumin: 4 g/dL (ref 3.5–5.0)
Alkaline Phosphatase: 84 U/L (ref 38–126)
Anion gap: 12 (ref 5–15)
BUN: 9 mg/dL (ref 6–20)
CO2: 23 mmol/L (ref 22–32)
Calcium: 9 mg/dL (ref 8.9–10.3)
Chloride: 102 mmol/L (ref 98–111)
Creatinine, Ser: 0.8 mg/dL (ref 0.44–1.00)
GFR, Estimated: >60 mL/min
Glucose, Bld: 96 mg/dL (ref 70–99)
Potassium: 3.9 mmol/L (ref 3.5–5.1)
Sodium: 138 mmol/L (ref 135–145)
Total Bilirubin: 0.6 mg/dL (ref 0.0–1.2)
Total Protein: 7.2 g/dL (ref 6.5–8.1)

## 2024-05-12 LAB — APTT: aPTT: 41 s — ABNORMAL HIGH (ref 24–36)

## 2024-05-12 LAB — ETHANOL: Alcohol, Ethyl (B): <15 mg/dL

## 2024-05-12 LAB — PROTIME-INR
INR: 1 (ref 0.8–1.2)
Prothrombin Time: 13.9 s (ref 11.4–15.2)

## 2024-05-12 MED ORDER — CYCLOBENZAPRINE HCL 10 MG PO TABS: 10.0000 mg | ORAL_TABLET | Freq: Three times a day (TID) | ORAL | 0 refills | Status: AC | PRN

## 2024-05-12 MED ORDER — LORAZEPAM 1 MG PO TABS
0.5000 mg | ORAL_TABLET | Freq: Once | ORAL | Status: AC
  Administered 2024-05-12: 0.5 mg via ORAL
  Filled 2024-05-12: qty 1

## 2024-05-12 MED ORDER — LOSARTAN POTASSIUM 50 MG PO TABS
50.0000 mg | ORAL_TABLET | Freq: Once | ORAL | Status: AC
  Administered 2024-05-12: 50 mg via ORAL
  Filled 2024-05-12: qty 1

## 2024-05-12 MED ORDER — LOSARTAN POTASSIUM 50 MG PO TABS: 50.0000 mg | ORAL_TABLET | Freq: Every day | ORAL | 11 refills | Status: AC

## 2024-05-12 MED ORDER — CYCLOBENZAPRINE HCL 10 MG PO TABS
10.0000 mg | ORAL_TABLET | Freq: Once | ORAL | Status: AC
  Administered 2024-05-12: 10 mg via ORAL
  Filled 2024-05-12: qty 1

## 2024-05-12 NOTE — ED Triage Notes (Signed)
 Pt c/o left shoulder pain x 3-4 days. Pt denies any injury. Today she has difficulty raising her left arm and her left hand feels cold. She also has stiffness and a tingling sensation in her left leg that started today. She applied muscle rub and ibuprofen  with minimal relief.

## 2024-05-12 NOTE — ED Notes (Signed)
 Called [facility] to initiate cardiac monitoring.

## 2024-05-12 NOTE — Discharge Instructions (Addendum)
 Please use ibuprofen (Motrin) up to 800 mg every 8 hours, naproxen (Naprosyn) up to 500 mg every 12 hours, and/or acetaminophen (Tylenol) up to 4 g/day for any continued pain.  Please do not use this medication regimen for longer than 7 days

## 2024-05-12 NOTE — Discharge Instructions (Addendum)
 Please go to [facility] to be evaluated for possible CVA.

## 2024-05-12 NOTE — ED Notes (Signed)
 Patient is being discharged from the Urgent Care and sent to the Emergency Department via EMS . Per Harriette Motto NP, patient is in need of higher level of care due to possible stroke. Patient is aware and verbalizes understanding of plan of care.  Vitals:   05/12/24 1333  BP: (!) 154/113  Pulse: 80  Resp: 18  Temp: 98.2 F (36.8 C)  SpO2: 96%

## 2024-05-12 NOTE — ED Notes (Signed)
 Patient transported to MRI

## 2024-05-12 NOTE — ED Triage Notes (Addendum)
 First nurse note: Pt to ED via [facility] fro [facility]. Pt reports stroke like symptoms x4-5 days ago and left shoulder pain x4-5 days. Pain 10/10. Hx of HTN and lupus and is non compliant with meds  167/113 72 HR  97% RA

## 2024-05-12 NOTE — ED Triage Notes (Signed)
 Pt reports left shoulder pain for a few days that is spreading. States when she woke up, she was unable to lift arm due to pain. Denies HA or dizziness.

## 2024-05-12 NOTE — ED Provider Notes (Signed)
 " [facility]    CSN: [id number] Arrival date & time: 05/12/24  1225      History   Chief Complaint Chief Complaint  Patient presents with   Shoulder Pain    HPI Lindsay Price is a 54 y.o. female.   HPI  54 year old female with past medical history noted for hypertension, lupus, and previous CVA presents for evaluation of 3 to 4 days worth of left shoulder pain with tingling and numbness in her left hand and stiffness and tingling in her left leg that started today.  He denies any headache.  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      Home Medications    Prior to Admission medications  Medication Sig Start Date End Date Taking? Authorizing Provider  apixaban  (ELIQUIS ) 2.5 MG TABS tablet Take 1 tablet (2.5 mg total) by mouth 2 (two) times daily. Patient not taking: Reported on 11/21/2021 11/23/20   Kip Lynwood Double, PA-C  aspirin EC 81 MG tablet Take 81 mg by mouth daily. Swallow whole.    [provider]  atorvastatin  (LIPITOR) 80 MG tablet Take 1 tablet (80 mg total) by mouth daily. 09/11/22 12/10/22  Joshua Cathryne BROCKS, MD  hydrochlorothiazide  (HYDRODIURIL ) 12.5 MG tablet Take 1 tablet (12.5 mg total) by mouth daily.  09/11/22   Joshua Cathryne BROCKS, MD  lidocaine  4 % Place onto the skin. 08/08/22   [provider]  lisinopril  (ZESTRIL ) 10 MG tablet Take 1 tablet (10 mg total) by mouth daily. 09/11/22   Joshua Cathryne BROCKS, MD  nicotine  (NICODERM CQ  - DOSED IN MG/24 HOURS) 21 mg/24hr patch Place onto the skin. Patient not taking: Reported on 09/11/2022 08/09/22   [provider]  amLODipine  (NORVASC ) 10 MG tablet Take 1 tablet (10 mg total) by mouth daily. 05/05/20 09/19/20  Tobie Calix, MD    Family History No family history on file.  Social History Social History[1]   Allergies   Patient has no known allergies.   Review of Systems Review of Systems  Musculoskeletal:  Positive for arthralgias.  Neurological:  Positive for facial asymmetry, weakness and numbness. Negative for dizziness, speech difficulty and headaches.     Physical Exam Triage Vital Signs ED Triage Vitals  Encounter Vitals Group     BP      Girls Systolic BP Percentile      Girls Diastolic BP Percentile      Boys Systolic BP Percentile      Boys Diastolic BP Percentile      Pulse      Resp      Temp  Temp src      SpO2      Weight      Height      Head Circumference      Peak Flow      Pain Score      Pain Loc      Pain Education      Exclude from Growth Chart    No data found.  Updated Vital Signs BP (!) 154/113 (BP Location: Right Arm)   Pulse 80   Temp 98.2 F (36.8 C) (Oral)   Resp 18   Wt 198 lb (89.8 kg)   LMP 08/16/2016   SpO2 96%   BMI 27.62 kg/m   Visual Acuity Right Eye Distance:   Left Eye Distance:   Bilateral Distance:    Right Eye Near:   Left Eye Near:    Bilateral Near:     Physical Exam Vitals and nursing note reviewed.  Constitutional:      Appearance: Normal appearance. She is not ill-appearing.  HENT:     Head: Normocephalic and atraumatic.  Eyes:     Extraocular Movements: Extraocular movements intact.     Pupils: Pupils are equal, round, and reactive to light.   Skin:    General: Skin is warm and dry.     Capillary Refill: Capillary refill takes less than 2 seconds.  Neurological:     General: No focal deficit present.     Mental Status: She is alert and oriented to person, place, and time.     Cranial Nerves: Cranial nerve deficit present.     Sensory: Sensory deficit present.     Motor: Weakness present.     Deep Tendon Reflexes: Reflexes abnormal.      UC Treatments / Results  Labs (all labs ordered are listed, but only abnormal results are displayed) Labs Reviewed - No data to display  EKG   Radiology No results found.  Procedures Procedures (including critical care time)  Medications Ordered in UC Medications - No data to display  Initial Impression / Assessment and Plan / UC Course  I have reviewed the triage vital signs and the nursing notes.  Pertinent labs & imaging results that were available during my care of the patient were reviewed by me and considered in my medical decision making (see chart for details).   Patient is a pleasant 54 year old female presenting for evaluation of left shoulder pain as outlined in HPI above.  The patient reports that she had pain similar to this several years ago and was told by her PCP that she had had a CVA.  She had no residual deficits at that time.  She reports that the shoulder pain feels similar to what her symptoms that she presented for stroke were several years ago.  In the exam room, patient has a left-sided facial droop  She has decreased sensation to the left side but the remainder of her cranial nerves are intact.  She is unable to lift her left arm secondary to both pain and weakness.  Grip strength in the left hand is 3/5 versus 5/5 in the right.  Also left lower extremity strength is 3/5 versus 5/5 in the right.  Decreased DTRs on the left-hand side.  I suspect that the patient has had a another CVA.  The question is whether or not she had it 3 to 4 days ago or it is more  acute given that the numbness did not begin until today.  We will  call EMS and have her transferred to [facility] for evaluation of possible CVA.  Report called to Delon, the charge nurse in the ER at [facility].  Report given to [facility].  Care transferred.   Final Clinical Impressions(s) / UC Diagnoses   Final diagnoses:  Cerebrovascular accident (CVA), unspecified mechanism [facility]     Discharge Instructions      Please go to [facility] to be evaluated for possible CVA.     ED Prescriptions   None    PDMP not reviewed this encounter.     [1]  Social History Tobacco Use   Smoking status: Every Day    Current packs/day: 0.50    Types: Cigarettes   Smokeless tobacco: Never  Vaping Use   Vaping status: Never Used  Substance Use Topics   Alcohol use: Yes    Comment: states 24 ounce can every 2 weeks   Drug use: Not Currently    Types: Marijuana     Bernardino Ditch, NP 05/12/24 1417  "

## 2024-05-12 NOTE — ED Provider Notes (Signed)
 "  [facility] Provider Note   Event Date/Time   First MD Initiated Contact with Patient 05/12/24 1606     (approximate) History  Weakness  HPI HIRA TRENT is a 54 y.o. female with a stated past medical history of lupus who presents complaining of of left anterior shoulder pain that has worsened over the last 5 days.  Patient states that she woke up this morning with significant weakness in this left upper extremity stating that it was difficult to lift her from the bed.  Patient states that the symptoms fully resolved over the course of this morning however her pain has persistently a 9-10/10 despite using over-the-counter medications.  Patient denies any numbness, paresthesias, or neck pain.  Patient does endorse tenderness to palpation over the anterior portion of the shoulder on the left.  Patient denies any trauma or recent injuries. ROS: Patient currently denies any vision changes, tinnitus, difficulty speaking, facial droop, sore throat, chest pain, shortness of breath, abdominal pain, nausea/vomiting/diarrhea, dysuria, or weakness/numbness/paresthesias in any extremity   Physical Exam  Triage Vital Signs: ED Triage Vitals [05/12/24 1457]  Encounter Vitals Group     BP (!) 151/115     Girls Systolic BP Percentile      Girls Diastolic BP Percentile      Boys Systolic BP Percentile      Boys Diastolic BP Percentile      Pulse Rate 73     Resp 18     Temp 98.2 F (36.8 C)     Temp Source Oral     SpO2 97 %     Weight 198 lb 6.6 oz (90 kg)     Height 5' 11 (1.803 m)     Head Circumference      Peak Flow      Pain Score 10     Pain Loc      Pain Education      Exclude from Growth Chart    Most recent vital signs: Vitals:   05/12/24 1457  BP: (!) 151/115  Pulse: 73  Resp: 18  Temp: 98.2 F (36.8 C)  SpO2: 97%   General: Awake, oriented x4. CV:  Good peripheral perfusion. Resp:  Normal effort. Abd:  No distention. Other:  Middle-aged  overweight African-American female resting comfortably in no acute distress.  Significant tenderness to palpation over the anterior and lateral left shoulder overlying the deltoid.  Patient has painful active and passive range of motion with abduction, flexion, horizontal abduction, and external rotation ED Results / Procedures / Treatments  Labs (all labs ordered are listed, but only abnormal results are displayed) Labs Reviewed  APTT - Abnormal; Notable for the following components:      Result Value   aPTT 41 (*)    All other components within normal limits  CBC - Abnormal; Notable for the following components:   RBC 3.77 (*)    MCV 108.2 (*)    MCH 36.3 (*)    All other components within normal limits  PROTIME-INR  DIFFERENTIAL  COMPREHENSIVE METABOLIC PANEL WITH GFR  ETHANOL  CBG MONITORING, ED   EKG ED ECG REPORT I, Artist MARLA Kerns, the attending physician, personally viewed and interpreted this ECG. Date: 05/12/2024 EKG Time: 1504 Rate: 72 Rhythm: normal sinus rhythm QRS Axis: normal Intervals: normal ST/T Wave abnormalities: normal Narrative Interpretation: no evidence of acute ischemia RADIOLOGY ED MD interpretation: CT of the head without contrast interpreted by me shows no evidence of  acute abnormalities including no intracerebral hemorrhage, obvious masses, or significant edema MRI brain shows no evidence of acute abnormalities - All radiology independently interpreted and agree with radiology assessment Official radiology report(s): No results found. PROCEDURES: Critical Care performed: No Procedures MEDICATIONS ORDERED IN ED: Medications - No data to display IMPRESSION / MDM / ASSESSMENT AND PLAN / ED COURSE  I reviewed the triage vital signs and the nursing notes.                             The patient is on the cardiac monitor to evaluate for evidence of arrhythmia and/or significant heart rate changes. Patient's presentation is most consistent with  acute presentation with potential threat to life or bodily function. Patient is a 54 year old female with the above-stated past medical history presents for worsening of left shoulder pain that is worse with active and passive range of motion DDx: TIA, CVA, rotator cuff injury, musculoskeletal injury Plan: CBC, CMP, INR, MRI brain, CT head  Patient's laboratory and radiologic evaluations not show any evidence of acute abnormalities.  At this point in concern the patient has musculoskeletal injury along the left shoulder.  Patient was placed in a sling and swath for comfort with instructions to follow-up with the orthopedic surgeons for further evaluation and management.  Patient given referral to Dr. Lorelle as well as prescription for Flexeril  in the outpatient setting.  Patient was given strict return precautions and all questions were answered prior to discharge  Dispo: Discharge home with orthopedic follow-up as needed   FINAL CLINICAL IMPRESSION(S) / ED DIAGNOSES   Final diagnoses:  None   Rx / DC Orders   ED Discharge Orders     None      Note:  This document was prepared using Dragon voice recognition software and may include unintentional dictation errors.   Silva Aamodt K, MD 05/12/24 2049  "
